# Patient Record
Sex: Female | Born: 1995 | Race: Black or African American | Hispanic: No | Marital: Single | Smoking: Former smoker
Health system: Southern US, Community
[De-identification: ages and names within clinical notes are randomized; demographics above are authoritative.]

## PROBLEM LIST (undated history)

## (undated) ENCOUNTER — Inpatient Hospital Stay (HOSPITAL_COMMUNITY): Payer: Self-pay

## (undated) DIAGNOSIS — R519 Headache, unspecified: Secondary | ICD-10-CM

## (undated) DIAGNOSIS — R51 Headache: Secondary | ICD-10-CM

## (undated) DIAGNOSIS — Z789 Other specified health status: Secondary | ICD-10-CM

## (undated) DIAGNOSIS — B977 Papillomavirus as the cause of diseases classified elsewhere: Secondary | ICD-10-CM

## (undated) HISTORY — PX: NO PAST SURGERIES: SHX2092

---
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
## 2017-05-01 LAB — OB RESULTS CONSOLE GC/CHLAMYDIA
CHLAMYDIA, DNA PROBE: NEGATIVE
Gonorrhea: NEGATIVE

## 2017-06-06 ENCOUNTER — Encounter: Payer: Self-pay | Admitting: *Deleted

## 2017-06-12 ENCOUNTER — Telehealth: Payer: Self-pay | Admitting: Family Medicine

## 2017-06-12 ENCOUNTER — Encounter: Payer: Self-pay | Admitting: Family Medicine

## 2017-06-12 NOTE — Telephone Encounter (Signed)
Called patient on 12/18, and 12/19 about needing to move her appointment. There was no voicemail so I could leave her a message. Will reschedule, and send letter.

## 2017-06-13 ENCOUNTER — Encounter: Payer: Self-pay | Admitting: Student

## 2017-06-21 ENCOUNTER — Encounter: Payer: Self-pay | Admitting: Certified Nurse Midwife

## 2017-07-01 ENCOUNTER — Encounter: Payer: Medicaid Other | Admitting: Advanced Practice Midwife

## 2017-07-12 ENCOUNTER — Ambulatory Visit (INDEPENDENT_AMBULATORY_CARE_PROVIDER_SITE_OTHER): Payer: Medicaid Other | Admitting: Family Medicine

## 2017-07-12 ENCOUNTER — Other Ambulatory Visit (HOSPITAL_COMMUNITY)
Admission: RE | Admit: 2017-07-12 | Discharge: 2017-07-12 | Disposition: A | Discharge status: Home / Self Care | Payer: Medicaid Other | Source: Ambulatory Visit | Attending: Family Medicine | Admitting: Family Medicine

## 2017-07-12 ENCOUNTER — Encounter: Payer: Self-pay | Admitting: Family Medicine

## 2017-07-12 VITALS — BP 100/60 | HR 90 | Ht 61.0 in | Wt 124.0 lb

## 2017-07-12 DIAGNOSIS — Z3402 Encounter for supervision of normal first pregnancy, second trimester: Secondary | ICD-10-CM | POA: Insufficient documentation

## 2017-07-12 DIAGNOSIS — Z23 Encounter for immunization: Secondary | ICD-10-CM | POA: Diagnosis not present

## 2017-07-12 DIAGNOSIS — O09899 Supervision of other high risk pregnancies, unspecified trimester: Secondary | ICD-10-CM | POA: Insufficient documentation

## 2017-07-12 DIAGNOSIS — O09892 Supervision of other high risk pregnancies, second trimester: Secondary | ICD-10-CM | POA: Diagnosis not present

## 2017-07-12 DIAGNOSIS — Z3490 Encounter for supervision of normal pregnancy, unspecified, unspecified trimester: Secondary | ICD-10-CM | POA: Insufficient documentation

## 2017-07-12 NOTE — Progress Notes (Signed)
  Subjective:    Anita Love is a G1P0 6224w1d being seen today for her first obstetrical visit.  Her obstetrical history is significant for teen pregnancy. Patient unsure if she intend to breast feed. Pregnancy history fully reviewed.  Patient reports no complaints.  Vitals:   07/12/17 1551 07/12/17 1553  BP: 100/60   Pulse: 90   Weight: 56.2 kg (124 lb)   Height:  5\' 1"  (1.549 m)    HISTORY: OB History  Gravida Para Term Preterm AB Living  1            SAB TAB Ectopic Multiple Live Births               # Outcome Date GA Lbr Len/2nd Weight Sex Delivery Anes PTL Lv  1 Current              No past medical history on file. No past surgical history on file. No family history on file.   Exam   BP 100/60   Pulse 90   Ht 5\' 1"  (1.549 m)   Wt 56.2 kg (124 lb)   LMP 02/07/2017 (Exact Date)   BMI 23.43 kg/m    CONSTITUTIONAL: Well-developed, well-nourished female in no acute distress.  EYES: EOM intact, conjunctivae normal, no scleral icterus HEAD: Normocephalic, atraumatic ENT: External right and left ear normal, oropharynx is clear and moist. CARDIOVASCULAR: Normal heart rate noted, regular rhythm. No cyanosis or edema. 2+ distal pulses.  RESPIRATORY: Clear to auscultation bilaterally. Effort and breath sounds normal, no problems with respiration noted. GASTROINTESTINAL:Soft, gravid.  normal bowel sounds, no distention noted.  No tenderness, rebound or guarding.  MUSCULOSKELETAL: Normal range of motion. No tenderness. SKIN: Skin is warm and dry. No rash noted. Not diaphoretic. No erythema. No pallor. NEUROLGIC: Alert and oriented to person, place, and time. Normal reflexes, muscle tone, coordination. No cranial nerve deficit noted. PSYCHIATRIC: Normal mood and affect. Normal behavior. Normal judgment and thought content. HEM/LYMPH/IMMUNOLOGIC: Neck supple, no masses.  Normal thyroid.      Assessment:    Pregnancy: G1P0 Patient Active Problem List   Diagnosis Date  Noted  . Encounter for supervision of normal pregnancy 07/12/2017        Plan:     Initial labs drawn. Prenatal vitamins. Problem list reviewed and updated. Genetic Screening discussed: too late  Ultrasound discussed; fetal survey: ordered.  Follow up in 4 weeks.  [address] Orchard DriveAmber GolovinMoss, DO 07/12/2017

## 2017-07-12 NOTE — Patient Instructions (Signed)

## 2017-07-13 LAB — OBSTETRIC PANEL, INCLUDING HIV
ANTIBODY SCREEN: NEGATIVE
BASOS: 0 %
Basophils Absolute: 0 10*3/uL (ref 0.0–0.2)
EOS (ABSOLUTE): 0 10*3/uL (ref 0.0–0.4)
Eos: 0 %
HEMATOCRIT: 37.2 % (ref 34.0–46.6)
HEP B S AG: NEGATIVE
HIV SCREEN 4TH GENERATION: NONREACTIVE
Hemoglobin: 12.2 g/dL (ref 11.1–15.9)
Immature Grans (Abs): 0 10*3/uL (ref 0.0–0.1)
Immature Granulocytes: 0 %
LYMPHS ABS: 1.9 10*3/uL (ref 0.7–3.1)
Lymphs: 21 %
MCH: 30.4 pg (ref 26.6–33.0)
MCHC: 32.8 g/dL (ref 31.5–35.7)
MCV: 93 fL (ref 79–97)
MONOCYTES: 7 %
Monocytes Absolute: 0.6 10*3/uL (ref 0.1–0.9)
Neutrophils Absolute: 6.2 10*3/uL (ref 1.4–7.0)
Neutrophils: 72 %
Platelets: 183 10*3/uL (ref 150–379)
RBC: 4.01 x10E6/uL (ref 3.77–5.28)
RDW: 13.5 % (ref 12.3–15.4)
RH TYPE: POSITIVE
RPR: NONREACTIVE
RUBELLA: 18.7 {index} (ref >0.99)
WBC: 8.7 10*3/uL (ref 3.4–10.8)

## 2017-07-14 LAB — CULTURE, OB URINE

## 2017-07-14 LAB — URINE CULTURE, OB REFLEX

## 2017-07-16 LAB — GC/CHLAMYDIA PROBE AMP (~~LOC~~) NOT AT ARMC
CHLAMYDIA, DNA PROBE: NEGATIVE
NEISSERIA GONORRHEA: NEGATIVE

## 2017-07-18 ENCOUNTER — Ambulatory Visit (HOSPITAL_COMMUNITY)
Admission: RE | Admit: 2017-07-18 | Discharge: 2017-07-18 | Disposition: A | Discharge status: Home / Self Care | Payer: Medicaid Other | Source: Ambulatory Visit | Attending: Family Medicine | Admitting: Family Medicine

## 2017-07-18 ENCOUNTER — Other Ambulatory Visit: Payer: Self-pay | Admitting: Family Medicine

## 2017-07-18 DIAGNOSIS — O0930 Supervision of pregnancy with insufficient antenatal care, unspecified trimester: Secondary | ICD-10-CM

## 2017-07-18 DIAGNOSIS — Z3689 Encounter for other specified antenatal screening: Secondary | ICD-10-CM

## 2017-07-18 DIAGNOSIS — Z3402 Encounter for supervision of normal first pregnancy, second trimester: Secondary | ICD-10-CM

## 2017-07-18 DIAGNOSIS — Z3A23 23 weeks gestation of pregnancy: Secondary | ICD-10-CM | POA: Insufficient documentation

## 2017-07-18 DIAGNOSIS — O0932 Supervision of pregnancy with insufficient antenatal care, second trimester: Secondary | ICD-10-CM | POA: Insufficient documentation

## 2017-07-22 LAB — SMN1 COPY NUMBER ANALYSIS (SMA CARRIER SCREENING)

## 2017-08-07 ENCOUNTER — Ambulatory Visit (INDEPENDENT_AMBULATORY_CARE_PROVIDER_SITE_OTHER): Payer: Medicaid Other | Admitting: Advanced Practice Midwife

## 2017-08-07 VITALS — BP 96/51 | HR 84 | Wt 127.8 lb

## 2017-08-07 DIAGNOSIS — Z3A27 27 weeks gestation of pregnancy: Secondary | ICD-10-CM

## 2017-08-07 DIAGNOSIS — Z362 Encounter for other antenatal screening follow-up: Secondary | ICD-10-CM

## 2017-08-07 DIAGNOSIS — Z3402 Encounter for supervision of normal first pregnancy, second trimester: Secondary | ICD-10-CM

## 2017-08-07 NOTE — Progress Notes (Signed)
   PRENATAL VISIT NOTE  Subjective:  Anita Love is a 22 y.o. G1P0 at 6570w6d being seen today for ongoing prenatal care.  She is currently monitored for the following issues for this low-risk pregnancy and has Encounter for supervision of normal pregnancy and High risk teen pregnancy on their problem list.  Patient reports no complaints.  Contractions: Not present. Vag. Bleeding: None.  Movement: Present. Denies leaking of fluid.   The following portions of the patient's history were reviewed and updated as appropriate: allergies, current medications, past family history, past medical history, past social history, past surgical history and problem list. Problem list updated.  Objective:   Vitals:   08/07/17 1202  BP: (!) 96/51  Pulse: 84  Weight: 127 lb 12.8 oz (58 kg)    Fetal Status: Fetal Heart Rate (bpm): 147 Fundal Height: 26 cm Movement: Present     General:  Alert, oriented and cooperative. Patient is in no acute distress.  Skin: Skin is warm and dry. No rash noted.   Cardiovascular: Normal heart rate noted  Respiratory: Normal respiratory effort, no problems with respiration noted  Abdomen: Soft, gravid, appropriate for gestational age.  Pain/Pressure: Present     Pelvic: Cervical exam deferred        Extremities: Normal range of motion.  Edema: None  Mental Status:  Normal mood and affect. Normal behavior. Normal judgment and thought content.   Assessment and Plan:  Pregnancy: G1P0 at 3070w6d  1. [redacted] weeks gestation of pregnancy  - US MFM OB FOLLOW UP; Future  2. Encounter for other antenatal screening follow-up  - US MFM OB FOLLOW UP; Future  3. Supervision of normal first teen pregnancy in second trimester  - US MFM OB FOLLOW UP; Future  4. Encounter for supervision of normal first pregnancy in second trimester  Preterm labor symptoms and general obstetric precautions including but not limited to vaginal bleeding, contractions, leaking of fluid and fetal  movement were reviewed in detail with the patient. Please refer to After Visit Summary for other counseling recommendations.  Return in about 2 weeks (around 08/21/2017) for ROB/GTT.   Dorathy KinsmanVirginia Fermon Ureta, CNM

## 2017-08-07 NOTE — Patient Instructions (Addendum)
TDaP Vaccine Pregnancy Get the Whooping Cough Vaccine While You Are Pregnant (CDC)  It is important for women to get the whooping cough vaccine in the third trimester of each pregnancy. Vaccines are the best way to prevent this disease. There are 2 different whooping cough vaccines. Both vaccines combine protection against whooping cough, tetanus and diphtheria, but they are for different age groups: Tdap: for everyone 11 years or older, including pregnant women  DTaP: for children 2 months through 6 years of age  You need the whooping cough vaccine during each of your pregnancies The recommended time to get the shot is during your 27th through 36th week of pregnancy, preferably during the earlier part of this time period. The Centers for Disease Control and Prevention (CDC) recommends that pregnant women receive the whooping cough vaccine for adolescents and adults (called Tdap vaccine) during the third trimester of each pregnancy. The recommended time to get the shot is during your 27th through 36th week of pregnancy, preferably during the earlier part of this time period. This replaces the original recommendation that pregnant women get the vaccine only if they had not previously received it. The American College of Obstetricians and Gynecologists and the American College of Nurse-Midwives support this recommendation.  You should get the whooping cough vaccine while pregnant to pass protection to your baby frame support disabled and/or not supported in this browser  Learn why Anita Love decided to get the whooping cough vaccine in her 3rd trimester of pregnancy and how her baby girl was born with some protection against the disease. Also available on YouTube. After receiving the whooping cough vaccine, your body will create protective antibodies (proteins produced by the body to fight off diseases) and pass some of them to your baby before birth. These antibodies provide your baby some short-term  protection against whooping cough in early life. These antibodies can also protect your baby from some of the more serious complications that come along with whooping cough. Your protective antibodies are at their highest about 2 weeks after getting the vaccine, but it takes time to pass them to your baby. So the preferred time to get the whooping cough vaccine is early in your third trimester. The amount of whooping cough antibodies in your body decreases over time. That is why CDC recommends you get a whooping cough vaccine during each pregnancy. Doing so allows each of your babies to get the greatest number of protective antibodies from you. This means each of your babies will get the best protection possible against this disease.  Getting the whooping cough vaccine while pregnant is better than getting the vaccine after you give birth Whooping cough vaccination during pregnancy is ideal so your baby will have short-term protection as soon as he is born. This early protection is important because your baby will not start getting his whooping cough vaccines until he is 2 months old. These first few months of life are when your baby is at greatest risk for catching whooping cough. This is also when he's at greatest risk for having severe, potentially life-threating complications from the infection. To avoid that gap in protection, it is best to get a whooping cough vaccine during pregnancy. You will then pass protection to your baby before he is born. To continue protecting your baby, he should get whooping cough vaccines starting at 2 months old. You may never have gotten the Tdap vaccine before and did not get it during this pregnancy. If so, you should make sure   to get the vaccine immediately after you give birth, before leaving the hospital or birthing center. It will take about 2 weeks before your body develops protection (antibodies) in response to the vaccine. Once you have protection from the vaccine,  you are less likely to give whooping cough to your newborn while caring for him. But remember, your baby will still be at risk for catching whooping cough from others. A recent study looked to see how effective Tdap was at preventing whooping cough in babies whose mothers got the vaccine while pregnant or in the hospital after giving birth. The study found that getting Tdap between 27 through 36 weeks of pregnancy is 85% more effective at preventing whooping cough in babies younger than 2 months old. Blood tests cannot tell if you need a whooping cough vaccine There are no blood tests that can tell you if you have enough antibodies in your body to protect yourself or your baby against whooping cough. Even if you have been sick with whooping cough in the past or previously received the vaccine, you still should get the vaccine during each pregnancy. Breastfeeding may pass some protective antibodies onto your baby By breastfeeding, you may pass some antibodies you have made in response to the vaccine to your baby. When you get a whooping cough vaccine during your pregnancy, you will have antibodies in your breast milk that you can share with your baby as soon as your milk comes in. However, your baby will not get protective antibodies immediately if you wait to get the whooping cough vaccine until after delivering your baby. This is because it takes about 2 weeks for your body to create antibodies. Learn more about the health benefits of breastfeeding.  AREA PEDIATRIC/FAMILY PRACTICE PHYSICIANS  [facility] [address] [place], [place]  [address] Phone - [phone]   Fax - [phone]  [facility] [address] [place], [place] [address] Phone - [phone]   Fax - [phone]  JACK AMOS [address] [place], [place]  [address] Phone - [phone]   Fax - [phone]  [facility] [address] [place]  [address] Phone - [phone]   Fax - [phone]  [facility] [address] [place], [place]  [address] Phone - [phone]   Fax - [phone]  [facility] [address], [address] [place], [place]  [address] Phone - [phone]   Fax - [phone]  [facility] [address] [place], [place]  [address] Phone - [phone]   Fax - [phone]  [facility] [address] [place], [place]  [address] Phone - [phone]   Fax - [phone]  [facility] [address] [place], [place]  [address] Phone - [phone]   Fax - [phone] [facility] [address] [place], [place]  [address] Phone - [phone]   Fax - [phone]  [facility] [address] [place], [place]  [address] Phone - [phone]   Fax - [phone]  [facility] [address], [address] [place], [place]  [address] Phone - [phone]   Fax - [phone]  [facility] [address] [place], [place]  [address] Phone - [phone]   Fax - [phone]  [facility] [address], [address]  [place], [place]  [address] Phone - [phone]  [facility] [address] [place], [place]  [address] Phone - [phone]   Fax - [phone]  [facility] [address] [place], [place]  [address] Phone - [phone]   Fax - [phone]  [facility] [address] [place], [place]  [address] Phone - [phone]   Fax - [phone]  [facility] [address] [place], [place]  [address] Phone - [phone]   Fax - [phone]   [facility] [address] [place], [place]  [address] Phone - [phone]   Fax - [phone]  [facility] [address] [place], [place]  [address] Phone - [phone]   Fax - [phone]  DAVID RUBIN [address] [place], [place]  [address] Phone - [phone]   Fax - [phone]  [facility] [address] [place], [place]  [address] Phone - [phone]   Fax - [phone]  [facility] Alita Chyle [address] [place], [place]  [address] Phone - [phone]   Fax - [phone] Gerarda Fraction [address] [place], [place]  [address] Phone - [phone]   Fax - [phone]  [facility] [address] [place], [place]  [address] Phone - [phone]   Fax - [phone]  [facility] [place] [address] [place], [place]  [address] Phone - [phone]   Fax - [phone]  [facility] [place] Wyvonne Lenz MD [address] [place] [address] Phone [phone]  Fax [phone]

## 2017-08-14 ENCOUNTER — Ambulatory Visit (HOSPITAL_COMMUNITY)
Admission: RE | Admit: 2017-08-14 | Discharge: 2017-08-14 | Disposition: A | Discharge status: Home / Self Care | Payer: Medicaid Other | Source: Ambulatory Visit | Attending: Advanced Practice Midwife | Admitting: Advanced Practice Midwife

## 2017-08-14 DIAGNOSIS — Z3A26 26 weeks gestation of pregnancy: Secondary | ICD-10-CM | POA: Diagnosis not present

## 2017-08-14 DIAGNOSIS — Z362 Encounter for other antenatal screening follow-up: Secondary | ICD-10-CM | POA: Insufficient documentation

## 2017-08-14 DIAGNOSIS — Z3402 Encounter for supervision of normal first pregnancy, second trimester: Secondary | ICD-10-CM | POA: Diagnosis present

## 2017-08-14 DIAGNOSIS — Z3A27 27 weeks gestation of pregnancy: Secondary | ICD-10-CM

## 2017-08-15 ENCOUNTER — Encounter: Payer: Self-pay | Admitting: Advanced Practice Midwife

## 2017-08-28 ENCOUNTER — Ambulatory Visit (INDEPENDENT_AMBULATORY_CARE_PROVIDER_SITE_OTHER): Payer: Medicaid Other | Admitting: Student

## 2017-08-28 VITALS — BP 114/63 | HR 89 | Wt 131.0 lb

## 2017-08-28 DIAGNOSIS — Z23 Encounter for immunization: Secondary | ICD-10-CM | POA: Diagnosis not present

## 2017-08-28 DIAGNOSIS — Z3402 Encounter for supervision of normal first pregnancy, second trimester: Secondary | ICD-10-CM

## 2017-08-28 DIAGNOSIS — Z3403 Encounter for supervision of normal first pregnancy, third trimester: Secondary | ICD-10-CM | POA: Diagnosis not present

## 2017-08-28 NOTE — Progress Notes (Signed)
   PRENATAL VISIT NOTE  Subjective:  Anita Love is a 82[place]21 y.o. G1P0 at 7580w6d being seen today for ongoing prenatal care.  She is currently monitored for the following issues for this low-risk pregnancy and has Encounter for supervision of normal pregnancy and High risk teen pregnancy on their problem list.  Patient reports no complaints.  Contractions: Not present. Vag. Bleeding: None.  Movement: Present. Denies leaking of fluid.   The following portions of the patient's history were reviewed and updated as appropriate: allergies, current medications, past family history, past medical history, past social history, past surgical history and problem list. Problem list updated.  Objective:   Vitals:   08/28/17 1043  BP: 114/63  Pulse: 89  Weight: 131 lb (59.4 kg)    Fetal Status: Fetal Heart Rate (bpm): 140 Fundal Height: 27 cm Movement: Present     General:  Alert, oriented and cooperative. Patient is in no acute distress.  Skin: Skin is warm and dry. No rash noted.   Cardiovascular: Normal heart rate noted  Respiratory: Normal respiratory effort, no problems with respiration noted  Abdomen: Soft, gravid, appropriate for gestational age.  Pain/Pressure: Present     Pelvic: Cervical exam deferred        Extremities: Normal range of motion.  Edema: None  Mental Status:  Normal mood and affect. Normal behavior. Normal judgment and thought content.   Assessment and Plan:  Pregnancy: G1P0 at 7080w6d  1. Encounter for supervision of normal first pregnancy in second trimester -Ultrasound x 2, RVOT not visualized. Discussed with patient. Patient would like additional ultrasound to see if it can be viewed. Ultrasound ordered and scheduled.  - CBC - HIV antibody - Glucose Tolerance, 2 Hours w/1 Hour - RPR - Tdap vaccine greater than or equal to 7yo IM - US MFM OB FOLLOW UP; Future  Preterm labor symptoms and general obstetric precautions including but not limited to vaginal bleeding,  contractions, leaking of fluid and fetal movement were reviewed in detail with the patient. Please refer to After Visit Summary for other counseling recommendations.  Return in about 2 weeks (around 09/11/2017) for Routine OB.   Judeth HornErin Tyrease Vandeberg, NP

## 2017-08-28 NOTE — Progress Notes (Signed)
Tdap given today 08/28/17

## 2017-08-28 NOTE — Patient Instructions (Signed)
Third Trimester of Pregnancy The third trimester is from week 28 through week 40 (months 7 through 9). The third trimester is a time when the unborn baby (fetus) is growing rapidly. At the end of the ninth month, the fetus is about 20 inches in length and weighs 6-10 pounds. Body changes during your third trimester Your body will continue to go through many changes during pregnancy. The changes vary from woman to woman. During the third trimester:  Your weight will continue to increase. You can expect to gain 25-35 pounds (11-16 kg) by the end of the pregnancy.  You may begin to get stretch marks on your hips, abdomen, and breasts.  You may urinate more often because the fetus is moving lower into your pelvis and pressing on your bladder.  You may develop or continue to have heartburn. This is caused by increased hormones that slow down muscles in the digestive tract.  You may develop or continue to have constipation because increased hormones slow digestion and cause the muscles that push waste through your intestines to relax.  You may develop hemorrhoids. These are swollen veins (varicose veins) in the rectum that can itch or be painful.  You may develop swollen, bulging veins (varicose veins) in your legs.  You may have increased body aches in the pelvis, back, or thighs. This is due to weight gain and increased hormones that are relaxing your joints.  You may have changes in your hair. These can include thickening of your hair, rapid growth, and changes in texture. Some women also have hair loss during or after pregnancy, or hair that feels dry or thin. Your hair will most likely return to normal after your baby is born.  Your breasts will continue to grow and they will continue to become tender. A yellow fluid (colostrum) may leak from your breasts. This is the first milk you are producing for your baby.  Your belly button may stick out.  You may notice more swelling in your hands,  face, or ankles.  You may have increased tingling or numbness in your hands, arms, and legs. The skin on your belly may also feel numb.  You may feel short of breath because of your expanding uterus.  You may have more problems sleeping. This can be caused by the size of your belly, increased need to urinate, and an increase in your body's metabolism.  You may notice the fetus "dropping," or moving lower in your abdomen (lightening).  You may have increased vaginal discharge.  You may notice your joints feel loose and you may have pain around your pelvic bone.  What to expect at prenatal visits You will have prenatal exams every 2 weeks until week 36. Then you will have weekly prenatal exams. During a routine prenatal visit:  You will be weighed to make sure you and the baby are growing normally.  Your blood pressure will be taken.  Your abdomen will be measured to track your baby's growth.  The fetal heartbeat will be listened to.  Any test results from the previous visit will be discussed.  You may have a cervical check near your due date to see if your cervix has softened or thinned (effaced).  You will be tested for Group B streptococcus. This happens between 35 and 37 weeks.  Your health care provider may ask you:  What your birth plan is.  How you are feeling.  If you are feeling the baby move.  If you have had   any abnormal symptoms, such as leaking fluid, bleeding, severe headaches, or abdominal cramping.  If you are using any tobacco products, including cigarettes, chewing tobacco, and electronic cigarettes.  If you have any questions.  Other tests or screenings that may be performed during your third trimester include:  Blood tests that check for low iron levels (anemia).  Fetal testing to check the health, activity level, and growth of the fetus. Testing is done if you have certain medical conditions or if there are problems during the  pregnancy.  Nonstress test (NST). This test checks the health of your baby to make sure there are no signs of problems, such as the baby not getting enough oxygen. During this test, a belt is placed around your belly. The baby is made to move, and its heart rate is monitored during movement.  What is false labor? False labor is a condition in which you feel small, irregular tightenings of the muscles in the womb (contractions) that usually go away with rest, changing position, or drinking water. These are called Braxton Hicks contractions. Contractions may last for hours, days, or even weeks before true labor sets in. If contractions come at regular intervals, become more frequent, increase in intensity, or become painful, you should see your health care provider. What are the signs of labor?  Abdominal cramps.  Regular contractions that start at 10 minutes apart and become stronger and more frequent with time.  Contractions that start on the top of the uterus and spread down to the lower abdomen and back.  Increased pelvic pressure and dull back pain.  A watery or bloody mucus discharge that comes from the vagina.  Leaking of amniotic fluid. This is also known as your "water breaking." It could be a slow trickle or a gush. Let your health care provider know if it has a color or strange odor. If you have any of these signs, call your health care provider right away, even if it is before your due date. Follow these instructions at home: Medicines  Follow your health care provider's instructions regarding medicine use. Specific medicines may be either safe or unsafe to take during pregnancy.  Take a prenatal vitamin that contains at least 600 micrograms (mcg) of folic acid.  If you develop constipation, try taking a stool softener if your health care provider approves. Eating and drinking  Eat a balanced diet that includes fresh fruits and vegetables, whole grains, good sources of protein  such as meat, eggs, or tofu, and low-fat dairy. Your health care provider will help you determine the amount of weight gain that is right for you.  Avoid raw meat and uncooked cheese. These carry germs that can cause birth defects in the baby.  If you have low calcium intake from food, talk to your health care provider about whether you should take a daily calcium supplement.  Eat four or five small meals rather than three large meals a day.  Limit foods that are high in fat and processed sugars, such as fried and sweet foods.  To prevent constipation: ? Drink enough fluid to keep your urine clear or pale yellow. ? Eat foods that are high in fiber, such as fresh fruits and vegetables, whole grains, and beans. Activity  Exercise only as directed by your health care provider. Most women can continue their usual exercise routine during pregnancy. Try to exercise for 30 minutes at least 5 days a week. Stop exercising if you experience uterine contractions.  Avoid heavy   lifting.  Do not exercise in extreme heat or humidity, or at high altitudes.  Wear low-heel, comfortable shoes.  Practice good posture.  You may continue to have sex unless your health care provider tells you otherwise. Relieving pain and discomfort  Take frequent breaks and rest with your legs elevated if you have leg cramps or low back pain.  Take warm sitz baths to soothe any pain or discomfort caused by hemorrhoids. Use hemorrhoid cream if your health care provider approves.  Wear a good support bra to prevent discomfort from breast tenderness.  If you develop varicose veins: ? Wear support pantyhose or compression stockings as told by your healthcare provider. ? Elevate your feet for 15 minutes, 3-4 times a day. Prenatal care  Write down your questions. Take them to your prenatal visits.  Keep all your prenatal visits as told by your health care provider. This is important. Safety  Wear your seat belt at  all times when driving.  Make a list of emergency phone numbers, including numbers for family, friends, the hospital, and police and fire departments. General instructions  Avoid cat litter boxes and soil used by cats. These carry germs that can cause birth defects in the baby. If you have a cat, ask someone to clean the litter box for you.  Do not travel far distances unless it is absolutely necessary and only with the approval of your health care provider.  Do not use hot tubs, steam rooms, or saunas.  Do not drink alcohol.  Do not use any products that contain nicotine or tobacco, such as cigarettes and e-cigarettes. If you need help quitting, ask your health care provider.  Do not use any medicinal herbs or unprescribed drugs. These chemicals affect the formation and growth of the baby.  Do not douche or use tampons or scented sanitary pads.  Do not cross your legs for long periods of time.  To prepare for the arrival of your baby: ? Take prenatal classes to understand, practice, and ask questions about labor and delivery. ? Make a trial run to the hospital. ? Visit the hospital and tour the maternity area. ? Arrange for maternity or paternity leave through employers. ? Arrange for family and friends to take care of pets while you are in the hospital. ? Purchase a rear-facing car seat and make sure you know how to install it in your car. ? Pack your hospital bag. ? Prepare the baby's nursery. Make sure to remove all pillows and stuffed animals from the baby's crib to prevent suffocation.  Visit your dentist if you have not gone during your pregnancy. Use a soft toothbrush to brush your teeth and be gentle when you floss. Contact a health care provider if:  You are unsure if you are in labor or if your water has broken.  You become dizzy.  You have mild pelvic cramps, pelvic pressure, or nagging pain in your abdominal area.  You have lower back pain.  You have persistent  nausea, vomiting, or diarrhea.  You have an unusual or bad smelling vaginal discharge.  You have pain when you urinate. Get help right away if:  Your water breaks before 37 weeks.  You have regular contractions less than 5 minutes apart before 37 weeks.  You have a fever.  You are leaking fluid from your vagina.  You have spotting or bleeding from your vagina.  You have severe abdominal pain or cramping.  You have rapid weight loss or weight gain.    You have shortness of breath with chest pain.  You notice sudden or extreme swelling of your face, hands, ankles, feet, or legs.  Your baby makes fewer than 10 movements in 2 hours.  You have severe headaches that do not go away when you take medicine.  You have vision changes. Summary  The third trimester is from week 28 through week 40, months 7 through 9. The third trimester is a time when the unborn baby (fetus) is growing rapidly.  During the third trimester, your discomfort may increase as you and your baby continue to gain weight. You may have abdominal, leg, and back pain, sleeping problems, and an increased need to urinate.  During the third trimester your breasts will keep growing and they will continue to become tender. A yellow fluid (colostrum) may leak from your breasts. This is the first milk you are producing for your baby.  False labor is a condition in which you feel small, irregular tightenings of the muscles in the womb (contractions) that eventually go away. These are called Braxton Hicks contractions. Contractions may last for hours, days, or even weeks before true labor sets in.  Signs of labor can include: abdominal cramps; regular contractions that start at 10 minutes apart and become stronger and more frequent with time; watery or bloody mucus discharge that comes from the vagina; increased pelvic pressure and dull back pain; and leaking of amniotic fluid. This information is not intended to replace advice  given to you by your health care provider. Make sure you discuss any questions you have with your health care provider. Document Released: 06/05/2001 Document Revised: 11/17/2015 Document Reviewed: 08/12/2012 [employer] Interactive Patient Education  2017 [employer]  

## 2017-08-29 LAB — CBC
Hematocrit: 37.1 % (ref 34.0–46.6)
Hemoglobin: 12.1 g/dL (ref 11.1–15.9)
MCH: 30.8 pg (ref 26.6–33.0)
MCHC: 32.6 g/dL (ref 31.5–35.7)
MCV: 94 fL (ref 79–97)
PLATELETS: 160 10*3/uL (ref 150–379)
RBC: 3.93 x10E6/uL (ref 3.77–5.28)
RDW: 13.9 % (ref 12.3–15.4)
WBC: 8.1 10*3/uL (ref 3.4–10.8)

## 2017-08-29 LAB — RPR: RPR Ser Ql: NONREACTIVE

## 2017-08-29 LAB — GLUCOSE TOLERANCE, 2 HOURS W/ 1HR
GLUCOSE, 1 HOUR: 150 mg/dL (ref 65–179)
GLUCOSE, 2 HOUR: 99 mg/dL (ref 65–152)
Glucose, Fasting: 73 mg/dL (ref 65–91)

## 2017-08-29 LAB — HIV ANTIBODY (ROUTINE TESTING W REFLEX): HIV SCREEN 4TH GENERATION: NONREACTIVE

## 2017-09-04 ENCOUNTER — Ambulatory Visit (HOSPITAL_COMMUNITY)
Admission: RE | Admit: 2017-09-04 | Discharge: 2017-09-04 | Disposition: A | Discharge status: Home / Self Care | Payer: Medicaid Other | Source: Ambulatory Visit | Attending: Student | Admitting: Student

## 2017-09-04 DIAGNOSIS — Z3A29 29 weeks gestation of pregnancy: Secondary | ICD-10-CM | POA: Insufficient documentation

## 2017-09-04 DIAGNOSIS — O358XX0 Maternal care for other (suspected) fetal abnormality and damage, not applicable or unspecified: Secondary | ICD-10-CM | POA: Diagnosis not present

## 2017-09-04 DIAGNOSIS — Z362 Encounter for other antenatal screening follow-up: Secondary | ICD-10-CM | POA: Insufficient documentation

## 2017-09-04 DIAGNOSIS — Z3402 Encounter for supervision of normal first pregnancy, second trimester: Secondary | ICD-10-CM

## 2017-09-10 ENCOUNTER — Telehealth: Payer: Self-pay | Admitting: Licensed Clinical Social Worker

## 2017-09-10 NOTE — Telephone Encounter (Signed)
CSW A.Felton ClintonFigueroa contacted pt to confirm appt for 09/11/2017. Pt confirmed appt with CSW.

## 2017-09-11 ENCOUNTER — Ambulatory Visit (INDEPENDENT_AMBULATORY_CARE_PROVIDER_SITE_OTHER): Payer: Medicaid Other | Admitting: Advanced Practice Midwife

## 2017-09-11 ENCOUNTER — Encounter: Payer: Self-pay | Admitting: Licensed Clinical Social Worker

## 2017-09-11 ENCOUNTER — Encounter: Payer: Self-pay | Admitting: Advanced Practice Midwife

## 2017-09-11 VITALS — BP 111/65 | HR 95 | Wt 136.1 lb

## 2017-09-11 DIAGNOSIS — Z3403 Encounter for supervision of normal first pregnancy, third trimester: Secondary | ICD-10-CM

## 2017-09-11 NOTE — Patient Instructions (Addendum)
AREA PEDIATRIC/FAMILY PRACTICE PHYSICIANS   [facility] [address], [address] [place], [place]  [address] Phone - [phone]   Fax - [phone]  [facility] [address] [place], [place] [address] Phone - [phone]   Fax - [phone]  [facility] AM[facility] [address] [place], [place]  [address] Phone - [phone]   Fax - [phone]  [facility] [address] [place], [place]  [address] Phone - [phone]   Fax - [phone]  [facility] [address] [place], [place]  [address] Phone - [phone]   Fax - [phone]  [facility] [address] [place], [place]  [address] Phone - [phone]   Fax - [phone]  [facility] [address] [place], [place]  [address] Phone - [phone]   Fax - [phone]  [facility] [address] [place], [place]  [address] Phone - [phone]   Fax - [phone]  [facility] [address] [place], [place]  [address] Phone - [phone]   Fax - [phone] [facility] [address] [place], [place]  [address] Phone - [phone]   Fax - [phone]  [facility] [address] [place], [place]  [address] Phone - [phone]   Fax - [phone]  [facility] [address] [place], [place]  [address] Phone - [phone]   Fax - [phone]  [facility] [address] [place], [place]  [address] Phone - [phone]   Fax - [phone]  SHILPA GOSRANI [address] [place], [place]  [address] Phone - [phone]  [facility] [address] [place], [place]  [address] Phone - [phone]   Fax - [phone]  [facility] [address] [place], [place]  [address] Phone - [phone]   Fax - [phone]  [facility] [address] [place], [place]  [address] Phone - [phone]   Fax - [phone]  [facility] [address] [place], [place]  [address] Phone - [phone]   Fax - [phone]   [facility] [address] [place], [place]  [address] Phone - [phone]   Fax - [phone]  [facility] [address] [place], [place]  [address] Phone - [phone]   Fax - [phone]  DAVID RUBIN [address] [place], [place]  [address] Phone - [phone]   Fax - [phone]  [facility] [address] [place], [place]  [address] Phone - [phone]   Fax - [phone]  [facility] [address] , [place]  [address] Phone - [phone]   Fax - [phone] [facility] [address] [place], [place]  [address] Phone - [phone]   Fax - [phone]  [facility] [address] [place], [place]  [address] Phone - [phone]   Fax - [phone]  [facility] [address] [place], [place]  [address] Phone - [phone]   Fax - [phone]  [facility] Charlene Flemming MD [address] [place]  [address] Phone [phone]  Fax [phone]  Childbirth Education Options: [facility] Classes:  Childbirth education classes can help you   get ready for a positive parenting experience. You can also meet other expectant parents and get free stuff for your baby. Each class runs for five weeks on the same night and costs $45 for the mother-to-be and her support person. Medicaid covers the cost if you are eligible. Call [phone] to register. [facility] Childbirth Education:  [phone] or [phone] or  [email or web address]  Baby & Me Class: Discuss newborn & infant parenting and family adjustment issues with other new mothers in a relaxed environment. Each week brings a new speaker or baby-centered activity. We encourage new mothers to join us every Thursday at 11:00am. Babies birth until crawling. No registration or fee. Daddy Boot Camp: This course offers Dads-to-be the tools and knowledge needed to feel confident on their journey to becoming new fathers. Experienced dads, who have been trained as coaches, teach dads-to-be how to hold, comfort, diaper, swaddle and play with their infant while being able to support the new mom as well. A class for men taught by men. $25/dad Big Brother/Big Sister: Let your children share in the joy of a new brother or sister in this special class designed just for them. Class includes discussion about how families care for babies: swaddling, holding, diapering, safety as well as how they can be helpful in their new role. This class is designed for children ages 2 to 6, but any age is welcome. Please register each child individually. $5/child  Mom Talk: This mom-led group offers support and connection to mothers as they journey through the adjustments and struggles of that sometimes overwhelming first year after the birth of a child. Tuesdays at 10:00am and Thursdays at 6:00pm. Babies welcome. No registration or fee. Breastfeeding Support Group: This group is a mother-to-mother support circle where moms have the opportunity to share their breastfeeding experiences. A Lactation Consultant is present for questions and concerns. Meets each Tuesday at 11:00am. No fee or registration. Breastfeeding Your Baby: Learn what to expect in the first days of breastfeeding your newborn.  This class will help you feel more confident with the skills needed to begin your breastfeeding experience. Many new mothers are concerned about breastfeeding after leaving the hospital. This class  will also address the most common fears and challenges about breastfeeding during the first few weeks, months and beyond. (call for fee) Comfort Techniques and Tour: This 2 hour interactive class will provide you the opportunity to learn & practice hands-on techniques that can help relieve some of the discomfort of labor and encourage your baby to rotate toward the best position for birth. You and your partner will be able to try a variety of labor positions with birth balls and rebozos as well as practice breathing, relaxation, and visualization techniques. A tour of the [facility] is included with this class. $20 per registrant and support person Childbirth Class- Weekend Option: This class is a Weekend version of our Birth & Baby series. It is designed for parents who have a difficult time fitting several weeks of classes into their schedule. It covers the care of your newborn and the basics of labor and childbirth. It also includes a Maternity Care Center Tour of [facility] and lunch. The class is held two consecutive days: beginning on Friday evening from 6:30 - 8:30 p.m. and the next day, Saturday from 9 a.m. - 4 p.m. (call for fee) Waterbirth Class: Interested in a waterbirth?  This informational class will help you discover whether waterbirth is the right fit for you.   Education about waterbirth itself, supplies you would need and how to assemble your support team is what you can expect from this class. Some obstetrical practices require this class in order to pursue a waterbirth. (Not all obstetrical practices offer waterbirth-check with your healthcare provider.) Register only the expectant mom, but you are encouraged to bring your partner to class! Required if planning waterbirth, no fee. Infant/Child CPR: Parents, grandparents, babysitters, and friends learn Cardio-Pulmonary Resuscitation skills for infants and children. You will also learn how to treat both conscious  and unconscious choking in infants and children. This Family & Friends program does not offer certification. Register each participant individually to ensure that enough mannequins are available. (Call for fee) Grandparent Love: Expecting a grandbaby? This class is for you! Learn about the latest infant care and safety recommendations and ways to support your own child as he or she transitions into the parenting role. Taught by Registered Nurses who are childbirth instructors, but most importantly...they are grandmothers too! $10/person. Childbirth Class- Natural Childbirth: This series of 5 weekly classes is for expectant parents who want to learn and practice natural methods of coping with the process of labor and childbirth. Relaxation, breathing, massage, visualization, role of the partner, and helpful positioning are highlighted. Participants learn how to be confident in their body's ability to give birth. This class will empower and help parents make informed decisions about their own care. Includes discussion that will help new parents transition into the immediate postpartum period. [facility] is included. We suggest taking this class between 25-32 weeks, but it's only a recommendation. $75 per registrant and one support person or $30 Medicaid. Childbirth Class- 3 week Series: This option of 3 weekly classes helps you and your labor partner prepare for childbirth. Newborn care, labor & birth, cesarean birth, pain management, and comfort techniques are discussed and a [place] [facility] is included. The class meets at the same time, on the same day of the week for 3 consecutive weeks beginning with the starting date you choose. $60 for registrant and one support person.  Marvelous Multiples: Expecting twins, triplets, or more? This class covers the differences in labor, birth, parenting, and breastfeeding issues that face multiples' parents.  NICU tour is included. Led by a Certified Childbirth Educator who is the mother of twins. No fee. Caring for Baby: This class is for expectant and adoptive parents who want to learn and practice the most up-to-date newborn care for their babies. Focus is on birth through the first six weeks of life. Topics include feeding, bathing, diapering, crying, umbilical cord care, circumcision care and safe sleep. Parents learn to recognize symptoms of illness and when to call the pediatrician. Register only the mom-to-be and your partner or support person can plan to come with you! $10 per registrant and support person Childbirth Class- online option: This online class offers you the freedom to complete a Birth and Baby series in the comfort of your own home. The flexibility of this option allows you to review sections at your own pace, at times convenient to you and your support people. It includes additional video information, animations, quizzes, and extended activities. Get organized with helpful eClass tools, checklists, and trackers. Once you register online for the class, you will receive an email within a few days to accept the invitation and begin the class when the time is right for you. The content will be available to you for 60 days. $  60 for 60 days of online access for you and your support people.  Local Doulas: Natural Baby Doulas [email or web address] Tel: [phone] [email or web address] [email or web address] [phone] [email or web address] The Labor Merla Riches  (also do waterbirth tub rental) [phone] [email or web address] [email or web address] Doula [phone] [email or web address] [facility] Rhythms  [phone] [email or web address] [employer] (PADA) [email or web address] [facility] Birth and Baby   [email or web address]  Contraception Choices Contraception, also called birth control, refers to methods or devices that prevent pregnancy. Hormonal methods Contraceptive implant A contraceptive implant is a thin, plastic tube that contains a hormone. It is inserted into the upper part of the arm. It can remain in place for up to 3 years. Progestin-only injections Progestin-only injections are injections of progestin, a synthetic form of the hormone progesterone. They are given every 3 months by a health care provider. Birth control pills Birth control pills are pills that contain hormones that prevent pregnancy. They must be taken once a day, preferably at the same time each day. Birth control patch The birth control patch contains hormones that prevent pregnancy. It is placed on the skin and must be changed once a week for three weeks and removed on the fourth week. A prescription is needed to use this method of contraception. Vaginal ring A vaginal ring contains hormones that prevent pregnancy. It is placed in the vagina for three weeks and removed on the fourth week. After that, the process is repeated with a new ring. A prescription is needed to use this method of contraception. Emergency contraceptive Emergency contraceptives prevent pregnancy after unprotected sex. They come in pill form and can be taken up to 5 days after sex. They work best the sooner they are taken after having sex. Most emergency contraceptives are available without a prescription. This method should not be used as your only form of birth control. Barrier methods Female condom A female condom is a thin sheath that is worn over the penis during sex. Condoms keep sperm from going inside a woman's body. They can be used with a spermicide to increase their effectiveness. They should be disposed after a single use. Female condom A female condom is a soft, loose-fitting sheath that is put into the vagina before sex.  The condom keeps sperm from going inside a woman's body. They should be disposed after a single use. Diaphragm A diaphragm is a soft, dome-shaped barrier. It is inserted into the vagina before sex, along with a spermicide. The diaphragm blocks sperm from entering the uterus, and the spermicide kills sperm. A diaphragm should be left in the vagina for 6-8 hours after sex and removed within 24 hours. A diaphragm is prescribed and fitted by a health care provider. A diaphragm should be replaced every 1-2 years, after giving birth, after gaining more than 15 lb (6.8 kg), and after pelvic surgery. Cervical cap A cervical cap is a round, soft latex or plastic cup that fits over the cervix. It is inserted into the vagina before sex, along with spermicide. It blocks sperm from entering the uterus. The cap should be left in place for 6-8 hours after sex and removed within 48 hours. A cervical cap must be prescribed and fitted by a health care provider. It should be replaced every 2 years. Sponge A sponge is a soft, circular piece of polyurethane foam with spermicide on it. The sponge helps block sperm from entering the uterus, and the spermicide kills sperm. To use it, you make it wet and then insert it into  the vagina. It should be inserted before sex, left in for at least 6 hours after sex, and removed and thrown away within 30 hours. Spermicides Spermicides are chemicals that kill or block sperm from entering the cervix and uterus. They can come as a cream, jelly, suppository, foam, or tablet. A spermicide should be inserted into the vagina with an applicator at least 10-15 minutes before sex to allow time for it to work. The process must be repeated every time you have sex. Spermicides do not require a prescription. Intrauterine contraception Intrauterine device (IUD) An IUD is a T-shaped device that is put in a woman's uterus. There are two types:  Hormone IUD.This type contains progestin, a synthetic form  of the hormone progesterone. This type can stay in place for 3-5 years.  Copper IUD.This type is wrapped in copper wire. It can stay in place for 10 years.  Permanent methods of contraception Female tubal ligation In this method, a woman's fallopian tubes are sealed, tied, or blocked during surgery to prevent eggs from traveling to the uterus. Hysteroscopic sterilization In this method, a small, flexible insert is placed into each fallopian tube. The inserts cause scar tissue to form in the fallopian tubes and block them, so sperm cannot reach an egg. The procedure takes about 3 months to be effective. Another form of birth control must be used during those 3 months. Female sterilization This is a procedure to tie off the tubes that carry sperm (vasectomy). After the procedure, the man can still ejaculate fluid (semen). Natural planning methods Natural family planning In this method, a couple does not have sex on days when the woman could become pregnant. Calendar method This means keeping track of the length of each menstrual cycle, identifying the days when pregnancy can happen, and not having sex on those days. Ovulation method In this method, a couple avoids sex during ovulation. Symptothermal method This method involves not having sex during ovulation. The woman typically checks for ovulation by watching changes in her temperature and in the consistency of cervical mucus. Post-ovulation method In this method, a couple waits to have sex until after ovulation. Summary  Contraception, also called birth control, means methods or devices that prevent pregnancy.  Hormonal methods of contraception include implants, injections, pills, patches, vaginal rings, and emergency contraceptives.  Barrier methods of contraception can include female condoms, female condoms, diaphragms, cervical caps, sponges, and spermicides.  There are two types of IUDs (intrauterine devices). An IUD can be put in a  woman's uterus to prevent pregnancy for 3-5 years.  Permanent sterilization can be done through a procedure for males, females, or both.  Natural family planning methods involve not having sex on days when the woman could become pregnant. This information is not intended to replace advice given to you by your health care provider. Make sure you discuss any questions you have with your health care provider. Document Released: 06/11/2005 Document Revised: 07/14/2016 Document Reviewed: 07/14/2016 Elsevier Interactive Patient Education  2018 [employer]

## 2017-09-11 NOTE — Progress Notes (Signed)
CSW A.Linton Rump met with pt privately to discuss any parenting concerns, mental health and family planning. Pt reports she is [facility] residing [facility] and receiving case management and social supports. Pt reports she is interested in obtaining nexaplon before she is discharged from the hospital. CSW A. Linton Rump met with pt to develop rapport and plans to meet with pt again before delivery.

## 2017-09-11 NOTE — Progress Notes (Signed)
   PRENATAL VISIT NOTE  Subjective:  Anita Love is a 22 y.o. G1P0 at 2989w6d being seen today for ongoing prenatal care.  She is currently monitored for the following issues for this low-risk pregnancy and has Encounter for supervision of normal pregnancy and High risk teen pregnancy on their problem list.  Patient reports no complaints.  Contractions: Not present. Vag. Bleeding: None.  Movement: Present. Denies leaking of fluid.   The following portions of the patient's history were reviewed and updated as appropriate: allergies, current medications, past family history, past medical history, past social history, past surgical history and problem list. Problem list updated.  Objective:   Vitals:   09/11/17 1147  BP: 111/65  Pulse: 95  Weight: 136 lb 1.6 oz (61.7 kg)    Fetal Status: Fetal Heart Rate (bpm): 145   Movement: Present     General:  Alert, oriented and cooperative. Patient is in no acute distress.  Skin: Skin is warm and dry. No rash noted.   Cardiovascular: Normal heart rate noted  Respiratory: Normal respiratory effort, no problems with respiration noted  Abdomen: Soft, gravid, appropriate for gestational age.  Pain/Pressure: Absent     Pelvic: Cervical exam deferred        Extremities: Normal range of motion.  Edema: None  Mental Status:  Normal mood and affect. Normal behavior. Normal judgment and thought content.   Assessment and Plan:  Pregnancy: G1P0 at 9589w6d  1. Supervision of normal first teen pregnancy in third trimester - Routine care  Preterm labor symptoms and general obstetric precautions including but not limited to vaginal bleeding, contractions, leaking of fluid and fetal movement were reviewed in detail with the patient. Please refer to After Visit Summary for other counseling recommendations.  Return in about 2 weeks (around 09/25/2017).   Thressa ShellerHeather Hogan, CNM

## 2017-09-12 ENCOUNTER — Telehealth: Payer: Self-pay | Admitting: Medical

## 2017-09-12 DIAGNOSIS — A084 Viral intestinal infection, unspecified: Secondary | ICD-10-CM

## 2017-09-12 MED ORDER — ONDANSETRON HCL 4 MG PO TABS: 4.0000 mg | ORAL_TABLET | Freq: Four times a day (QID) | ORAL | 0 refills | Status: DC

## 2017-09-12 NOTE — Addendum Note (Signed)
Addended by: Jill SideAY, Dalbert Stillings L on: 09/12/2017 12:25 PM   Modules accepted: Orders

## 2017-09-12 NOTE — Telephone Encounter (Signed)
Patient called office complaining of N/V x 2 days. Multiple sick contact at work recently. Advised of warning symptoms to go to MAU including contractions, bleeding, LOF or decreased FM. Patient would like Rx for Zofran to try to manage symptoms at home for now. Rx sent to pharmacy of choice. Patient voiced understanding. All questions answered.   Marny LowensteinWenzel, Julie N, PA-C 09/12/2017 9:05 AM

## 2017-09-16 ENCOUNTER — Telehealth: Payer: Self-pay | Admitting: General Practice

## 2017-09-16 NOTE — Telephone Encounter (Signed)
Patient called and left message on nurse line stating a prescription for nausea medication [facility]. Called patient and she states she was able to get her Rx. Patient had no questions

## 2017-09-24 ENCOUNTER — Telehealth: Payer: Self-pay | Admitting: Licensed Clinical Social Worker

## 2017-09-24 NOTE — Telephone Encounter (Signed)
Pt confirmed scheduled appt

## 2017-09-25 ENCOUNTER — Ambulatory Visit (INDEPENDENT_AMBULATORY_CARE_PROVIDER_SITE_OTHER): Payer: Medicaid Other | Admitting: Nurse Practitioner

## 2017-09-25 VITALS — BP 111/68 | HR 99 | Wt 137.3 lb

## 2017-09-25 DIAGNOSIS — Z3403 Encounter for supervision of normal first pregnancy, third trimester: Secondary | ICD-10-CM

## 2017-09-25 DIAGNOSIS — Z3400 Encounter for supervision of normal first pregnancy, unspecified trimester: Secondary | ICD-10-CM

## 2017-09-25 NOTE — Patient Instructions (Signed)
Braxton Hicks Contractions °Contractions of the uterus can occur throughout pregnancy, but they are not always a sign that you are in labor. You may have practice contractions called Braxton Hicks contractions. These false labor contractions are sometimes confused with true labor. °What are Braxton Hicks contractions? °Braxton Hicks contractions are tightening movements that occur in the muscles of the uterus before labor. Unlike true labor contractions, these contractions do not result in opening (dilation) and thinning of the cervix. Toward the end of pregnancy (32-34 weeks), Braxton Hicks contractions can happen more often and may become stronger. These contractions are sometimes difficult to tell apart from true labor because they can be very uncomfortable. You should not feel embarrassed if you go to the hospital with false labor. °Sometimes, the only way to tell if you are in true labor is for your health care provider to look for changes in the cervix. The health care provider will do a physical exam and may monitor your contractions. If you are not in true labor, the exam should show that your cervix is not dilating and your water has not broken. °If there are other health problems associated with your pregnancy, it is completely safe for you to be sent home with false labor. You may continue to have Braxton Hicks contractions until you go into true labor. °How to tell the difference between true labor and false labor °True labor °· Contractions last 30-70 seconds. °· Contractions become very regular. °· Discomfort is usually felt in the top of the uterus, and it spreads to the lower abdomen and low back. °· Contractions do not go away with walking. °· Contractions usually become more intense and increase in frequency. °· The cervix dilates and gets thinner. °False labor °· Contractions are usually shorter and not as strong as true labor contractions. °· Contractions are usually irregular. °· Contractions  are often felt in the front of the lower abdomen and in the groin. °· Contractions may go away when you walk around or change positions while lying down. °· Contractions get weaker and are shorter-lasting as time goes on. °· The cervix usually does not dilate or become thin. °Follow these instructions at home: °· Take over-the-counter and prescription medicines only as told by your health care provider. °· Keep up with your usual exercises and follow other instructions from your health care provider. °· Eat and drink lightly if you think you are going into labor. °· If Braxton Hicks contractions are making you uncomfortable: °? Change your position from lying down or resting to walking, or change from walking to resting. °? Sit and rest in a tub of warm water. °? Drink enough fluid to keep your urine pale yellow. Dehydration may cause these contractions. °? Do slow and deep breathing several times an hour. °· Keep all follow-up prenatal visits as told by your health care provider. This is important. °Contact a health care provider if: °· You have a fever. °· You have continuous pain in your abdomen. °Get help right away if: °· Your contractions become stronger, more regular, and closer together. °· You have fluid leaking or gushing from your vagina. °· You pass blood-tinged mucus (bloody show). °· You have bleeding from your vagina. °· You have low back pain that you never had before. °· You feel your baby’s head pushing down and causing pelvic pressure. °· Your baby is not moving inside you as much as it used to. °Summary °· Contractions that occur before labor are called Braxton   Hicks contractions, false labor, or practice contractions. °· Braxton Hicks contractions are usually shorter, weaker, farther apart, and less regular than true labor contractions. True labor contractions usually become progressively stronger and regular and they become more frequent. °· Manage discomfort from Braxton Hicks contractions by  changing position, resting in a warm bath, drinking plenty of water, or practicing deep breathing. °This information is not intended to replace advice given to you by your health care provider. Make sure you discuss any questions you have with your health care provider. °Document Released: 10/25/2016 Document Revised: 10/25/2016 Document Reviewed: 10/25/2016 °Elsevier Interactive Patient Education © 2018 Elsevier Inc. ° °

## 2017-09-25 NOTE — Progress Notes (Signed)
    Subjective:  Anita Love is a 22 y.o. G1P0 at [redacted]w[redacted]d being seen today for ongoing prenatal care.  She is currently monitored for the following issues for this low-risk pregnancy and has Encounter for supervision of normal pregnancy and High risk teen pregnancy on their problem list.  Patient reports no complaints.  Contractions: Irregular. Vag. Bleeding: None.  Movement: Present. Denies leaking of fluid.   The following portions of the patient's history were reviewed and updated as appropriate: allergies, current medications, past family history, past medical history, past social history, past surgical history and problem list. Problem list updated.  Objective:   Vitals:   09/25/17 1207  BP: 111/68  Pulse: 99  Weight: 137 lb 4.8 oz (62.3 kg)    Fetal Status: Fetal Heart Rate (bpm): 135 Fundal Height: 34 cm Movement: Present     General:  Alert, oriented and cooperative. Patient is in no acute distress.  Skin: Skin is warm and dry. No rash noted.   Cardiovascular: Normal heart rate noted  Respiratory: Normal respiratory effort, no problems with respiration noted  Abdomen: Soft, gravid, appropriate for gestational age. Pain/Pressure: Absent     Pelvic:  Cervical exam deferred        Extremities: Normal range of motion.  Edema: None  Mental Status: Normal mood and affect. Normal behavior. Normal judgment and thought content.   Urinalysis:      Assessment and Plan:  Pregnancy: G1P0 at [redacted]w[redacted]d  1. Supervision of normal first pregnancy, antepartum Doing well today  Preterm labor symptoms and general obstetric precautions including but not limited to vaginal bleeding, contractions, leaking of fluid and fetal movement were reviewed in detail with the patient. Please refer to After Visit Summary for other counseling recommendations.  Return in about 2 weeks (around 10/09/2017).  Nolene Bernheim, RN, MSN, NP-BC Nurse Practitioner, [facility] for [employer], [facility] 09/25/2017 12:27 PM

## 2017-10-08 ENCOUNTER — Inpatient Hospital Stay (HOSPITAL_COMMUNITY)
Admission: AD | Admit: 2017-10-08 | Discharge: 2017-10-09 | Disposition: A | Discharge status: Home / Self Care | Payer: Medicaid Other | Source: Ambulatory Visit | Attending: Obstetrics and Gynecology | Admitting: Obstetrics and Gynecology

## 2017-10-08 ENCOUNTER — Encounter (HOSPITAL_COMMUNITY): Payer: Self-pay | Admitting: *Deleted

## 2017-10-08 ENCOUNTER — Inpatient Hospital Stay (HOSPITAL_COMMUNITY): Payer: Medicaid Other

## 2017-10-08 DIAGNOSIS — O368930 Maternal care for other specified fetal problems, third trimester, not applicable or unspecified: Secondary | ICD-10-CM | POA: Diagnosis not present

## 2017-10-08 DIAGNOSIS — O26893 Other specified pregnancy related conditions, third trimester: Secondary | ICD-10-CM | POA: Diagnosis not present

## 2017-10-08 DIAGNOSIS — Z3A34 34 weeks gestation of pregnancy: Secondary | ICD-10-CM | POA: Diagnosis not present

## 2017-10-08 DIAGNOSIS — R102 Pelvic and perineal pain: Secondary | ICD-10-CM | POA: Diagnosis present

## 2017-10-08 DIAGNOSIS — O368390 Maternal care for abnormalities of the fetal heart rate or rhythm, unspecified trimester, not applicable or unspecified: Secondary | ICD-10-CM

## 2017-10-08 HISTORY — DX: Other specified health status: Z78.9

## 2017-10-08 MED ORDER — COMFORT FIT MATERNITY SUPP MED MISC: 1.0000 | Freq: Every day | 1 refills | Status: DC

## 2017-10-08 MED ORDER — TRAMADOL HCL 50 MG PO TABS
50.0000 mg | ORAL_TABLET | Freq: Once | ORAL | Status: AC
  Administered 2017-10-08: 50 mg via ORAL

## 2017-10-08 MED ORDER — TRAMADOL HCL 50 MG PO TABS
50.0000 mg | ORAL_TABLET | Freq: Once | ORAL | Status: DC
  Filled 2017-10-08 (×2): qty 1

## 2017-10-08 NOTE — Discharge Instructions (Signed)
Third Trimester of Pregnancy The third trimester is from week 28 through week 40 (months 7 through 9). The third trimester is a time when the unborn baby (fetus) is growing rapidly. At the end of the ninth month, the fetus is about 20 inches in length and weighs 6-10 pounds. Body changes during your third trimester Your body will continue to go through many changes during pregnancy. The changes vary from woman to woman. During the third trimester:  Your weight will continue to increase. You can expect to gain 25-35 pounds (11-16 kg) by the end of the pregnancy.  You may begin to get stretch marks on your hips, abdomen, and breasts.  You may urinate more often because the fetus is moving lower into your pelvis and pressing on your bladder.  You may develop or continue to have heartburn. This is caused by increased hormones that slow down muscles in the digestive tract.  You may develop or continue to have constipation because increased hormones slow digestion and cause the muscles that push waste through your intestines to relax.  You may develop hemorrhoids. These are swollen veins (varicose veins) in the rectum that can itch or be painful.  You may develop swollen, bulging veins (varicose veins) in your legs.  You may have increased body aches in the pelvis, back, or thighs. This is due to weight gain and increased hormones that are relaxing your joints.  You may have changes in your hair. These can include thickening of your hair, rapid growth, and changes in texture. Some women also have hair loss during or after pregnancy, or hair that feels dry or thin. Your hair will most likely return to normal after your baby is born.  Your breasts will continue to grow and they will continue to become tender. A yellow fluid (colostrum) may leak from your breasts. This is the first milk you are producing for your baby.  Your belly button may stick out.  You may notice more swelling in your hands,  face, or ankles.  You may have increased tingling or numbness in your hands, arms, and legs. The skin on your belly may also feel numb.  You may feel short of breath because of your expanding uterus.  You may have more problems sleeping. This can be caused by the size of your belly, increased need to urinate, and an increase in your body's metabolism.  You may notice the fetus "dropping," or moving lower in your abdomen (lightening).  You may have increased vaginal discharge.  You may notice your joints feel loose and you may have pain around your pelvic bone.  What to expect at prenatal visits You will have prenatal exams every 2 weeks until week 36. Then you will have weekly prenatal exams. During a routine prenatal visit:  You will be weighed to make sure you and the baby are growing normally.  Your blood pressure will be taken.  Your abdomen will be measured to track your baby's growth.  The fetal heartbeat will be listened to.  Any test results from the previous visit will be discussed.  You may have a cervical check near your due date to see if your cervix has softened or thinned (effaced).  You will be tested for Group B streptococcus. This happens between 35 and 37 weeks.  Your health care provider may ask you:  What your birth plan is.  How you are feeling.  If you are feeling the baby move.  If you have had   any abnormal symptoms, such as leaking fluid, bleeding, severe headaches, or abdominal cramping.  If you are using any tobacco products, including cigarettes, chewing tobacco, and electronic cigarettes.  If you have any questions.  Other tests or screenings that may be performed during your third trimester include:  Blood tests that check for low iron levels (anemia).  Fetal testing to check the health, activity level, and growth of the fetus. Testing is done if you have certain medical conditions or if there are problems during the  pregnancy.  Nonstress test (NST). This test checks the health of your baby to make sure there are no signs of problems, such as the baby not getting enough oxygen. During this test, a belt is placed around your belly. The baby is made to move, and its heart rate is monitored during movement.  What is false labor? False labor is a condition in which you feel small, irregular tightenings of the muscles in the womb (contractions) that usually go away with rest, changing position, or drinking water. These are called Braxton Hicks contractions. Contractions may last for hours, days, or even weeks before true labor sets in. If contractions come at regular intervals, become more frequent, increase in intensity, or become painful, you should see your health care provider. What are the signs of labor?  Abdominal cramps.  Regular contractions that start at 10 minutes apart and become stronger and more frequent with time.  Contractions that start on the top of the uterus and spread down to the lower abdomen and back.  Increased pelvic pressure and dull back pain.  A watery or bloody mucus discharge that comes from the vagina.  Leaking of amniotic fluid. This is also known as your "water breaking." It could be a slow trickle or a gush. Let your health care provider know if it has a color or strange odor. If you have any of these signs, call your health care provider right away, even if it is before your due date. Follow these instructions at home: Medicines  Follow your health care provider's instructions regarding medicine use. Specific medicines may be either safe or unsafe to take during pregnancy.  Take a prenatal vitamin that contains at least 600 micrograms (mcg) of folic acid.  If you develop constipation, try taking a stool softener if your health care provider approves. Eating and drinking  Eat a balanced diet that includes fresh fruits and vegetables, whole grains, good sources of protein  such as meat, eggs, or tofu, and low-fat dairy. Your health care provider will help you determine the amount of weight gain that is right for you.  Avoid raw meat and uncooked cheese. These carry germs that can cause birth defects in the baby.  If you have low calcium intake from food, talk to your health care provider about whether you should take a daily calcium supplement.  Eat four or five small meals rather than three large meals a day.  Limit foods that are high in fat and processed sugars, such as fried and sweet foods.  To prevent constipation: ? Drink enough fluid to keep your urine clear or pale yellow. ? Eat foods that are high in fiber, such as fresh fruits and vegetables, whole grains, and beans. Activity  Exercise only as directed by your health care provider. Most women can continue their usual exercise routine during pregnancy. Try to exercise for 30 minutes at least 5 days a week. Stop exercising if you experience uterine contractions.  Avoid heavy   lifting.  Do not exercise in extreme heat or humidity, or at high altitudes.  Wear low-heel, comfortable shoes.  Practice good posture.  You may continue to have sex unless your health care provider tells you otherwise. Relieving pain and discomfort  Take frequent breaks and rest with your legs elevated if you have leg cramps or low back pain.  Take warm sitz baths to soothe any pain or discomfort caused by hemorrhoids. Use hemorrhoid cream if your health care provider approves.  Wear a good support bra to prevent discomfort from breast tenderness.  If you develop varicose veins: ? Wear support pantyhose or compression stockings as told by your healthcare provider. ? Elevate your feet for 15 minutes, 3-4 times a day. Prenatal care  Write down your questions. Take them to your prenatal visits.  Keep all your prenatal visits as told by your health care provider. This is important. Safety  Wear your seat belt at  all times when driving.  Make a list of emergency phone numbers, including numbers for family, friends, the hospital, and police and fire departments. General instructions  Avoid cat litter boxes and soil used by cats. These carry germs that can cause birth defects in the baby. If you have a cat, ask someone to clean the litter box for you.  Do not travel far distances unless it is absolutely necessary and only with the approval of your health care provider.  Do not use hot tubs, steam rooms, or saunas.  Do not drink alcohol.  Do not use any products that contain nicotine or tobacco, such as cigarettes and e-cigarettes. If you need help quitting, ask your health care provider.  Do not use any medicinal herbs or unprescribed drugs. These chemicals affect the formation and growth of the baby.  Do not douche or use tampons or scented sanitary pads.  Do not cross your legs for long periods of time.  To prepare for the arrival of your baby: ? Take prenatal classes to understand, practice, and ask questions about labor and delivery. ? Make a trial run to the hospital. ? Visit the hospital and tour the maternity area. ? Arrange for maternity or paternity leave through employers. ? Arrange for family and friends to take care of pets while you are in the hospital. ? Purchase a rear-facing car seat and make sure you know how to install it in your car. ? Pack your hospital bag. ? Prepare the baby's nursery. Make sure to remove all pillows and stuffed animals from the baby's crib to prevent suffocation.  Visit your dentist if you have not gone during your pregnancy. Use a soft toothbrush to brush your teeth and be gentle when you floss. Contact a health care provider if:  You are unsure if you are in labor or if your water has broken.  You become dizzy.  You have mild pelvic cramps, pelvic pressure, or nagging pain in your abdominal area.  You have lower back pain.  You have persistent  nausea, vomiting, or diarrhea.  You have an unusual or bad smelling vaginal discharge.  You have pain when you urinate. Get help right away if:  Your water breaks before 37 weeks.  You have regular contractions less than 5 minutes apart before 37 weeks.  You have a fever.  You are leaking fluid from your vagina.  You have spotting or bleeding from your vagina.  You have severe abdominal pain or cramping.  You have rapid weight loss or weight gain.    You have shortness of breath with chest pain.  You notice sudden or extreme swelling of your face, hands, ankles, feet, or legs.  Your baby makes fewer than 10 movements in 2 hours.  You have severe headaches that do not go away when you take medicine.  You have vision changes. Summary  The third trimester is from week 28 through week 40, months 7 through 9. The third trimester is a time when the unborn baby (fetus) is growing rapidly.  During the third trimester, your discomfort may increase as you and your baby continue to gain weight. You may have abdominal, leg, and back pain, sleeping problems, and an increased need to urinate.  During the third trimester your breasts will keep growing and they will continue to become tender. A yellow fluid (colostrum) may leak from your breasts. This is the first milk you are producing for your baby.  False labor is a condition in which you feel small, irregular tightenings of the muscles in the womb (contractions) that eventually go away. These are called Braxton Hicks contractions. Contractions may last for hours, days, or even weeks before true labor sets in.  Signs of labor can include: abdominal cramps; regular contractions that start at 10 minutes apart and become stronger and more frequent with time; watery or bloody mucus discharge that comes from the vagina; increased pelvic pressure and dull back pain; and leaking of amniotic fluid. This information is not intended to replace advice  given to you by your health care provider. Make sure you discuss any questions you have with your health care provider. Document Released: 06/05/2001 Document Revised: 11/17/2015 Document Reviewed: 08/12/2012 Elsevier Interactive Patient Education  2017 Elsevier Inc.  

## 2017-10-08 NOTE — MAU Note (Signed)
Pt presents to MAU c/o pelvic pressure that started a couple of days ago and has worsened today. [facility] [place] lot [place]. Pt reports no bleeding or LOF. Pt reports +FM. Pt is having a hard time ambulating due to pain.

## 2017-10-08 NOTE — MAU Provider Note (Addendum)
Chief Complaint:  Pelvic Pain   First Provider Initiated Contact with Patient 10/08/17 2201     HPI: Anita Love is a 22 y.o. G1P0 at [id number] presents to maternity admissions reporting pelvic pain [place].  Does not think it is contractions.  "just hurts"  Hurts more when walking. .She reports good fetal movement, denies LOF, vaginal bleeding, vaginal itching/burning, urinary symptoms, h/a, dizziness, n/v, diarrhea, constipation or fever/chills.    Pelvic Pain  The patient's primary symptoms include pelvic pain. The patient's pertinent negatives include no genital itching, genital lesions, genital odor or vaginal bleeding. This is a new problem. The current episode started in the past 7 days. The problem occurs intermittently. The problem has been unchanged. She is pregnant. Associated symptoms include abdominal pain (more so pelvic pain, in bones). Pertinent negatives include no back pain, chills, constipation, diarrhea, dysuria, fever, nausea or vomiting. The symptoms are aggravated by activity. She has tried nothing for the symptoms.    RN Note Pt presents to MAU c/o pelvic pressure that started a couple of days ago and has worsened today. [facility] and [place] lot of walking. Pt reports no bleeding or LOF. Pt reports +FM. Pt is having a hard time ambulating due to pain.     Past Medical History: Past Medical History:  Diagnosis Date  . Medical history non-contributory     Past obstetric history: OB History  Gravida Para Term Preterm AB Living  1            SAB TAB Ectopic Multiple Live Births               # Outcome Date GA Lbr Len/2nd Weight Sex Delivery Anes PTL Lv  1 Current             Past Surgical History: Past Surgical History:  Procedure Laterality Date  . NO PAST SURGERIES      Family History: Family History  Problem Relation Age of Onset  . ADD / ADHD Neg Hx   . Anxiety disorder Neg Hx   . Cancer Neg  Hx   . Hypertension Neg Hx   . Miscarriages / Stillbirths Neg Hx   . Vision loss Neg Hx   . Varicose Veins Neg Hx   . Stroke Neg Hx   . Obesity Neg Hx   . Learning disabilities Neg Hx   . Kidney disease Neg Hx   . Intellectual disability Neg Hx   . Hyperlipidemia Neg Hx   . Heart disease Neg Hx   . Early death Neg Hx   . Hearing loss Neg Hx   . Drug abuse Neg Hx   . Diabetes Neg Hx   . Depression Neg Hx   . COPD Neg Hx   . Birth defects Neg Hx   . Asthma Neg Hx   . Arthritis Neg Hx   . Alcohol abuse Neg Hx     Social History: Social History   Tobacco Use  . Smoking status: Never Smoker  Substance Use Topics  . Alcohol use: No  . Drug use: No    Allergies: No Known Allergies  Meds:  Medications Prior to Admission  Medication Sig Dispense Refill Last Dose  . ondansetron (ZOFRAN) 4 MG tablet Take 1 tablet (4 mg total) by mouth every 6 (six) hours. 12 tablet 0 Past Month at Unknown time  . Prenatal Vit-Fe Fumarate-FA (PRENATAL MULTIVITAMIN) TABS tablet Take  1 tablet by mouth daily at 12 noon.   10/08/2017 at Unknown time  . ALPRAZolam (XANAX PO) Take 5 tablets by mouth once.   Not Taking  . naproxen sodium (ANAPROX) 220 MG tablet Take 440 mg by mouth 2 (two) times daily as needed (headaches).   Not Taking    I have reviewed patient's Past Medical Hx, Surgical Hx, Family Hx, Social Hx, medications and allergies.   ROS:  Review of Systems  Constitutional: Negative for chills and fever.  Gastrointestinal: Positive for abdominal pain (more so pelvic pain, in bones). Negative for constipation, diarrhea, nausea and vomiting.  Genitourinary: Positive for pelvic pain. Negative for dysuria.  Musculoskeletal: Negative for back pain.   Other systems negative  Physical Exam  No data found. Constitutional: Well-developed, well-nourished female in no acute distress.  Cardiovascular: normal rate and rhythm Respiratory: normal effort, clear to auscultation bilaterally GI: Abd  soft, non-tender, gravid appropriate for gestational age.   No rebound or guarding. MS: Extremities nontender, no edema, normal ROM Neurologic: Alert and oriented x 4.  GU: Neg CVAT.  PELVIC EXAM: Dilation: Closed Effacement (%): Thick Station: Ballotable Exam by:: Wynelle Bourgeois CNM     FHT:  Baseline 145 , moderate variability, accelerations present, no decelerations except for one variable, occurring spontaneously Contractions:  Irregular and infrequent   Labs: O/Positive/-- (01/18 1623) No results found for this or any previous visit (from the past 24 hour(s)).  Imaging:  BPP done due to single spontaneous variable deceleration AFI normal BPP 8/8 FHR reactive after [place]  MAU Course/MDM: NST reviewed and was reactive   She did have one variable decel so we did an [place] for BPP which was reassuring  Treatments in MAU included EFM, BPP.    Assessment: [place] IUP at [redacted]w[redacted]d Pelvic pain likely due to cartilage laxity and increased activity today Single FHR decel with subsequent reactive FHR, normal BPP, and normal fluid  Plan: Discharge home Preterm Labor precautions and fetal kick counts Rx Maternity Support Belt Follow up in Office for prenatal visits and recheck  Encouraged to return here or to other Urgent Care/ED if she develops worsening of symptoms, increase in pain, fever, or other concerning symptoms.   Pt stable at time of discharge.  Wynelle Bourgeois CNM, MSN Certified Nurse-Midwife 10/08/2017 10:01 PM

## 2017-10-09 DIAGNOSIS — Z3A34 34 weeks gestation of pregnancy: Secondary | ICD-10-CM

## 2017-10-09 DIAGNOSIS — O26893 Other specified pregnancy related conditions, third trimester: Secondary | ICD-10-CM | POA: Diagnosis not present

## 2017-10-09 DIAGNOSIS — R102 Pelvic and perineal pain: Secondary | ICD-10-CM

## 2017-10-10 ENCOUNTER — Other Ambulatory Visit (HOSPITAL_COMMUNITY)
Admission: RE | Admit: 2017-10-10 | Discharge: 2017-10-10 | Disposition: A | Discharge status: Home / Self Care | Payer: Medicaid Other | Source: Ambulatory Visit | Attending: Student | Admitting: Student

## 2017-10-10 ENCOUNTER — Ambulatory Visit (INDEPENDENT_AMBULATORY_CARE_PROVIDER_SITE_OTHER): Payer: Medicaid Other | Admitting: Student

## 2017-10-10 VITALS — BP 105/73 | HR 92 | Wt 142.6 lb

## 2017-10-10 DIAGNOSIS — Z3400 Encounter for supervision of normal first pregnancy, unspecified trimester: Secondary | ICD-10-CM

## 2017-10-10 DIAGNOSIS — Z3403 Encounter for supervision of normal first pregnancy, third trimester: Secondary | ICD-10-CM

## 2017-10-10 LAB — OB RESULTS CONSOLE GC/CHLAMYDIA: Gonorrhea: NEGATIVE

## 2017-10-10 LAB — OB RESULTS CONSOLE GBS: GBS: NEGATIVE

## 2017-10-10 NOTE — Patient Instructions (Signed)

## 2017-10-10 NOTE — Progress Notes (Signed)
   PRENATAL VISIT NOTE  Subjective:  Anita Love is a 22 y.o. G1P0 at 1522w0d being seen today for ongoing prenatal care.  She is currently monitored for the following issues for this low-risk pregnancy and has Encounter for supervision of normal pregnancy and High risk teen pregnancy on their problem list.  Patient reports pelvic pain. .  Contractions: Irregular. Vag. Bleeding: None.  Movement: Present. Denies leaking of fluid.   The following portions of the patient's history were reviewed and updated as appropriate: allergies, current medications, past family history, past medical history, past social history, past surgical history and problem list. Problem list updated.  Objective:   Vitals:   10/10/17 1557  BP: 105/73  Pulse: 92  Weight: 142 lb 9.6 oz (64.7 kg)    Fetal Status: Fetal Heart Rate (bpm): 134  Fundal Height: 35 cm Movement: Present     General:  Alert, oriented and cooperative. Patient is in no acute distress.  Skin: Skin is warm and dry. No rash noted.   Cardiovascular: Normal heart rate noted  Respiratory: Normal respiratory effort, no problems with respiration noted  Abdomen: Soft, gravid, appropriate for gestational age.  Pain/Pressure: Present     Pelvic: Cervical exam deferred        Extremities: Normal range of motion.  Edema: Trace  Mental Status: Normal mood and affect. Normal behavior. Normal judgment and thought content.   Assessment and Plan:  Pregnancy: G1P0 at 3322w0d  1. Supervision of normal first pregnancy, antepartum -Reviewed normalcy of pelvic pain or pressure at this point in pregnancy. Reviewed warning signs and when to come to MAU.  -Discussed breast feeding  tips - Culture, beta strep (group b only) - GC/Chlamydia probe amp ([place])[facility]  Preterm labor symptoms and general obstetric precautions including but not limited to vaginal bleeding, contractions, leaking of fluid and fetal movement were reviewed in detail with the  patient. Please refer to After Visit Summary for other counseling recommendations.  Return in about 1 week (around 10/17/2017).  Future Appointments  Date Time Provider Department Center  10/18/2017  1:35 PM Armando ReichertHogan, Heather D, CNM [facility]    Charlesetta GaribaldiKathryn Lorraine LynwoodKooistra, [place]

## 2017-10-11 LAB — GC/CHLAMYDIA PROBE AMP (~~LOC~~) NOT AT ARMC
Chlamydia: NEGATIVE
NEISSERIA GONORRHEA: NEGATIVE

## 2017-10-13 ENCOUNTER — Inpatient Hospital Stay (HOSPITAL_COMMUNITY)
Admission: AD | Admit: 2017-10-13 | Discharge: 2017-10-13 | Disposition: A | Discharge status: Home / Self Care | Payer: Medicaid Other | Source: Ambulatory Visit | Attending: Family Medicine | Admitting: Family Medicine

## 2017-10-13 ENCOUNTER — Other Ambulatory Visit: Payer: Self-pay | Admitting: Student

## 2017-10-13 ENCOUNTER — Encounter (HOSPITAL_COMMUNITY): Payer: Self-pay

## 2017-10-13 DIAGNOSIS — N898 Other specified noninflammatory disorders of vagina: Secondary | ICD-10-CM | POA: Diagnosis present

## 2017-10-13 DIAGNOSIS — O26893 Other specified pregnancy related conditions, third trimester: Secondary | ICD-10-CM | POA: Insufficient documentation

## 2017-10-13 DIAGNOSIS — O479 False labor, unspecified: Secondary | ICD-10-CM

## 2017-10-13 DIAGNOSIS — Z3A35 35 weeks gestation of pregnancy: Secondary | ICD-10-CM | POA: Diagnosis not present

## 2017-10-13 DIAGNOSIS — O4703 False labor before 37 completed weeks of gestation, third trimester: Secondary | ICD-10-CM

## 2017-10-13 DIAGNOSIS — O4700 False labor before 37 completed weeks of gestation, unspecified trimester: Secondary | ICD-10-CM

## 2017-10-13 LAB — WET PREP, GENITAL
Clue Cells Wet Prep HPF POC: NONE SEEN
Sperm: NONE SEEN
Trich, Wet Prep: NONE SEEN
YEAST WET PREP: NONE SEEN

## 2017-10-13 LAB — URINALYSIS, ROUTINE W REFLEX MICROSCOPIC
Bilirubin Urine: NEGATIVE
GLUCOSE, UA: NEGATIVE mg/dL
HGB URINE DIPSTICK: NEGATIVE
KETONES UR: NEGATIVE mg/dL
Leukocytes, UA: NEGATIVE
Nitrite: NEGATIVE
PROTEIN: NEGATIVE mg/dL
Specific Gravity, Urine: 1.01 (ref 1.005–1.030)
pH: 7 (ref 5.0–8.0)

## 2017-10-13 LAB — AMNISURE RUPTURE OF MEMBRANE (ROM) NOT AT ARMC: Amnisure ROM: NEGATIVE

## 2017-10-13 MED ORDER — BETAMETHASONE SOD PHOS & ACET 6 (3-3) MG/ML IJ SUSP: 12.0000 mg | Freq: Once | INTRAMUSCULAR | Status: DC

## 2017-10-13 MED ORDER — BETAMETHASONE SOD PHOS & ACET 6 (3-3) MG/ML IJ SUSP
12.0000 mg | INTRAMUSCULAR | Status: DC
  Administered 2017-10-13: 12 mg via INTRAMUSCULAR
  Filled 2017-10-13: qty 2

## 2017-10-13 NOTE — MAU Provider Note (Addendum)
Patient Anita Love is a 22 y.o. G1P0 at 5059w3d here with complaints of vaginal leaking and decreased fetal movements. She denies vaginal bleeding, dysuria, NV, HA or blurry vision. She has had on-going pelvic pain and discomfort; she was seen in the MAU for this complaint on 10-08-2017.   History     CSN: [id number]  Arrival date and time: 10/13/17 [id number]   First Provider Initiated Contact with Patient 10/13/17 1039      Chief Complaint  Patient presents with  . Rupture of Membranes   Vaginal Discharge  The patient's primary symptoms include vaginal discharge. The patient's pertinent negatives include no genital itching, genital lesions or genital odor. This is a new problem. The current episode started today. The problem occurs rarely. The problem has been resolved. Pertinent negatives include no back pain, constipation, headaches, urgency or vomiting. The vaginal discharge was clear. There has been no bleeding. She has not been passing clots. She has not been passing tissue.   Patient states that she was lying on her bed last night and she felt wetness on her pajama pants. She denies odor, blood or mucous. It happened one time.   OB History    Gravida  1   Para      Term      Preterm      AB      Living        SAB      TAB      Ectopic      Multiple      Live Births              Past Medical History:  Diagnosis Date  . Medical history non-contributory     Past Surgical History:  Procedure Laterality Date  . NO PAST SURGERIES      Family History  Problem Relation Age of Onset  . ADD / ADHD Neg Hx   . Anxiety disorder Neg Hx   . Cancer Neg Hx   . Hypertension Neg Hx   . Miscarriages / Stillbirths Neg Hx   . Vision loss Neg Hx   . Varicose Veins Neg Hx   . Stroke Neg Hx   . Obesity Neg Hx   . Learning disabilities Neg Hx   . Kidney disease Neg Hx   . Intellectual disability Neg Hx   . Hyperlipidemia Neg Hx   . Heart disease Neg Hx   . Early  death Neg Hx   . Hearing loss Neg Hx   . Drug abuse Neg Hx   . Diabetes Neg Hx   . Depression Neg Hx   . COPD Neg Hx   . Birth defects Neg Hx   . Asthma Neg Hx   . Arthritis Neg Hx   . Alcohol abuse Neg Hx     Social History   Tobacco Use  . Smoking status: Never Smoker  . Smokeless tobacco: Never Used  Substance Use Topics  . Alcohol use: No  . Drug use: No    Allergies: No Known Allergies  Medications Prior to Admission  Medication Sig Dispense Refill Last Dose  . Prenatal Vit-Fe Fumarate-FA (PRENATAL MULTIVITAMIN) TABS tablet Take 1 tablet by mouth daily at 12 noon.   10/12/2017 at Unknown time  . Elastic Bandages & Supports (COMFORT FIT MATERNITY SUPP MED) MISC 1 Device by Does not apply route daily. (Patient not taking: Reported on 10/10/2017) 1 each 1 Not Taking  . ondansetron (ZOFRAN) 4 MG tablet Take  1 tablet (4 mg total) by mouth every 6 (six) hours. (Patient not taking: Reported on 10/13/2017) 12 tablet 0 Not Taking at Unknown time    Review of Systems  Gastrointestinal: Negative for constipation and vomiting.  Genitourinary: Positive for vaginal discharge. Negative for urgency.  Musculoskeletal: Negative for back pain.  Neurological: Negative for headaches.   Physical Exam   Blood pressure 120/67, pulse 97, temperature 97.7 F (36.5 C), temperature source Oral, resp. rate 18, weight 142 lb (64.4 kg), last menstrual period 02/07/2017.  Physical Exam  Constitutional: She appears well-developed.  HENT:  Head: Normocephalic.  Neck: Normal range of motion.  GI: Soft.  Genitourinary:  Genitourinary Comments: Niormal external female genitalia; no pooling on speculum exam. No CMT, suprapubic or adnexal tenderness. Cervix is 1 cm, thick and posterior.   Musculoskeletal: Normal range of motion.  Neurological: She is alert.  Skin: Skin is warm and dry.  Psychiatric: She has a normal mood and affect.    MAU Course  Procedures  MDM -Patient had picture of  leaking of fluid on her pajama bottoms. Substantial sized wet-area observed covering the seat of her pants; will do amnisure for verification.  -frequent contractions on the EFM that patient endorses feeling like "pressure", although does not endorse that they are "coming and going" -wet prep negative  -BMZ x 1, patient will return tomorrow for 2nd shot.  -Cervix unchanged after 1 hour of monitoring.  -NST: 135 bpm, mod var, present acel, neg decels, contractions q2-3 min  Assessment and Plan   1. Vaginal discharge   2. Preterm contractions    2. Patient stable for discharge with information on pre-term birth.  3. Plan to return to MAU on 10-14-2017 for 2nd BMZ shot.  4. Plan to keep appt at [facility] on 10[facility]18-2017.  5. Warning signs and when to return to MAU reviewed.  6. Patient and family verbalized understanding.   Charlesetta Garibaldi Kooistra 10/13/2017, 10:39 AM

## 2017-10-13 NOTE — Discharge Instructions (Signed)
°-  return to MAU on Monday, April 22 at 10 am for repeat shot to help baby's lungs develop  Preterm Labor and Birth Information Pregnancy normally lasts 39-41 weeks. Preterm labor is when labor starts early. It starts before you have been pregnant for 37 whole weeks. What are the risk factors for preterm labor? Preterm labor is more likely to occur in women who:  Have an infection while pregnant.  Have a cervix that is short.  Have gone into preterm labor before.  Have had surgery on their cervix.  Are younger than age 20.  Are older than age 20.  Are African American.  Are pregnant with two or more babies.  Take street drugs while pregnant.  Smoke while pregnant.  Do not gain enough weight while pregnant.  Got pregnant right after another pregnancy.  What are the symptoms of preterm labor? Symptoms of preterm labor include:  Cramps. The cramps may feel like the cramps some women get during their period. The cramps may happen with watery poop (diarrhea).  Pain in the belly (abdomen).  Pain in the lower back.  Regular contractions or tightening. It may feel like your belly is getting tighter.  Pressure in the lower belly that seems to get stronger.  More fluid (discharge) leaking from the vagina. The fluid may be watery or bloody.  Water breaking.  Why is it important to notice signs of preterm labor? Babies who are born early may not be fully developed. They have a higher chance for:  Long-term heart problems.  Long-term lung problems.  Trouble controlling body systems, like breathing.  Bleeding in the brain.  A condition called cerebral palsy.  Learning difficulties.  Death.  These risks are highest for babies who are born before 34 weeks of pregnancy. How is preterm labor treated? Treatment depends on:  How long you were pregnant.  Your condition.  The health of your baby.  Treatment may involve:  Having a stitch (suture) placed in your  cervix. When you give birth, your cervix opens so the baby can come out. The stitch keeps the cervix from opening too soon.  Staying at the hospital.  Taking or getting medicines, such as: ? Hormone medicines. ? Medicines to stop contractions. ? Medicines to help the babys lungs develop. ? Medicines to prevent your baby from having cerebral palsy.  What should I do if I am in preterm labor? If you think you are going into labor too soon, call your doctor right away. How can I prevent preterm labor?  Do not use any tobacco products. ? Examples of these are cigarettes, chewing tobacco, and e-cigarettes. ? If you need help quitting, ask your doctor.  Do not use street drugs.  Do not use any medicines unless you ask your doctor if they are safe for you.  Talk with your doctor before taking any herbal supplements.  Make sure you gain enough weight.  Watch for infection. If you think you might have an infection, get it checked right away.  If you have gone into preterm labor before, tell your doctor. This information is not intended to replace advice given to you by your health care provider. Make sure you discuss any questions you have with your health care provider. Document Released: 09/07/2008 Document Revised: 11/22/2015 Document Reviewed: 11/02/2015 Elsevier Interactive Patient Education  2018 [employer]

## 2017-10-13 NOTE — MAU Note (Signed)
Pt said she woke up and thought her water broke, had wet pants, no bleeding. Baby not moving as much this morning as usual, no contractions.

## 2017-10-14 ENCOUNTER — Inpatient Hospital Stay (HOSPITAL_COMMUNITY)
Admission: AD | Admit: 2017-10-14 | Discharge: 2017-10-14 | Disposition: A | Discharge status: Home / Self Care | Payer: Medicaid Other | Source: Ambulatory Visit | Attending: Obstetrics & Gynecology | Admitting: Obstetrics & Gynecology

## 2017-10-14 DIAGNOSIS — Z3A35 35 weeks gestation of pregnancy: Secondary | ICD-10-CM | POA: Diagnosis not present

## 2017-10-14 DIAGNOSIS — O4703 False labor before 37 completed weeks of gestation, third trimester: Secondary | ICD-10-CM | POA: Insufficient documentation

## 2017-10-14 LAB — CULTURE, BETA STREP (GROUP B ONLY): STREP GP B CULTURE: NEGATIVE

## 2017-10-14 MED ORDER — BETAMETHASONE SOD PHOS & ACET 6 (3-3) MG/ML IJ SUSP
12.0000 mg | Freq: Once | INTRAMUSCULAR | Status: AC
  Administered 2017-10-14: 12 mg via INTRAMUSCULAR
  Filled 2017-10-14: qty 2

## 2017-10-14 NOTE — MAU Note (Signed)
Pt reports increased pressure today and she thinks she lost her mucous plug.

## 2017-10-18 ENCOUNTER — Encounter: Payer: Self-pay | Admitting: Advanced Practice Midwife

## 2017-10-18 ENCOUNTER — Ambulatory Visit (INDEPENDENT_AMBULATORY_CARE_PROVIDER_SITE_OTHER): Payer: Medicaid Other | Admitting: Advanced Practice Midwife

## 2017-10-18 VITALS — BP 113/64 | HR 109 | Wt 144.2 lb

## 2017-10-18 DIAGNOSIS — Z3400 Encounter for supervision of normal first pregnancy, unspecified trimester: Secondary | ICD-10-CM

## 2017-10-18 DIAGNOSIS — Z3403 Encounter for supervision of normal first pregnancy, third trimester: Secondary | ICD-10-CM

## 2017-10-18 NOTE — Patient Instructions (Addendum)
Etonogestrel implant What is this medicine? ETONOGESTREL (et oh noe JES trel) is a contraceptive (birth control) device. It is used to prevent pregnancy. It can be used for up to 3 years. This medicine may be used for other purposes; ask your health care provider or pharmacist if you have questions. COMMON BRAND NAME(S): Implanon, Nexplanon What should I tell my health care provider before I take this medicine? They need to know if you have any of these conditions: -abnormal vaginal bleeding -blood vessel disease or blood clots -cancer of the breast, cervix, or liver -depression -diabetes -gallbladder disease -headaches -heart disease or recent heart attack -high blood pressure -high cholesterol -kidney disease -liver disease -renal disease -seizures -tobacco smoker -an unusual or allergic reaction to etonogestrel, other hormones, anesthetics or antiseptics, medicines, foods, dyes, or preservatives -pregnant or trying to get pregnant -breast-feeding How should I use this medicine? This device is inserted just under the skin on the inner side of your upper arm by a health care professional. Talk to your pediatrician regarding the use of this medicine in children. Special care may be needed. Overdosage: If you think you have taken too much of this medicine contact a poison control center or emergency room at once. NOTE: This medicine is only for you. Do not share this medicine with others. What if I miss a dose? This does not apply. What may interact with this medicine? Do not take this medicine with any of the following medications: -amprenavir -bosentan -fosamprenavir This medicine may also interact with the following medications: -barbiturate medicines for inducing sleep or treating seizures -certain medicines for fungal infections like ketoconazole and itraconazole -grapefruit juice -griseofulvin -medicines to treat seizures like carbamazepine, felbamate, oxcarbazepine,  phenytoin, topiramate -modafinil -phenylbutazone -rifampin -rufinamide -some medicines to treat HIV infection like atazanavir, indinavir, lopinavir, nelfinavir, tipranavir, ritonavir -St. John's wort This list may not describe all possible interactions. Give your health care provider a list of all the medicines, herbs, non-prescription drugs, or dietary supplements you use. Also tell them if you smoke, drink alcohol, or use illegal drugs. Some items may interact with your medicine. What should I watch for while using this medicine? This product does not protect you against HIV infection (AIDS) or other sexually transmitted diseases. You should be able to feel the implant by pressing your fingertips over the skin where it was inserted. Contact your doctor if you cannot feel the implant, and use a non-hormonal birth control method (such as condoms) until your doctor confirms that the implant is in place. If you feel that the implant may have broken or become bent while in your arm, contact your healthcare provider. What side effects may I notice from receiving this medicine? Side effects that you should report to your doctor or health care professional as soon as possible: -allergic reactions like skin rash, itching or hives, swelling of the face, lips, or tongue -breast lumps -changes in emotions or moods -depressed mood -heavy or prolonged menstrual bleeding -pain, irritation, swelling, or bruising at the insertion site -scar at site of insertion -signs of infection at the insertion site such as fever, and skin redness, pain or discharge -signs of pregnancy -signs and symptoms of a blood clot such as breathing problems; changes in vision; chest pain; severe, sudden headache; pain, swelling, warmth in the leg; trouble speaking; sudden numbness or weakness of the face, arm or leg -signs and symptoms of liver injury like dark yellow or brown urine; general ill feeling or flu-like symptoms;  light-colored   stools; loss of appetite; nausea; right upper belly pain; unusually weak or tired; yellowing of the eyes or skin -unusual vaginal bleeding, discharge -signs and symptoms of a stroke like changes in vision; confusion; trouble speaking or understanding; severe headaches; sudden numbness or weakness of the face, arm or leg; trouble walking; dizziness; loss of balance or coordination Side effects that usually do not require medical attention (report to your doctor or health care professional if they continue or are bothersome): -acne -back pain -breast pain -changes in weight -dizziness -general ill feeling or flu-like symptoms -headache -irregular menstrual bleeding -nausea -sore throat -vaginal irritation or inflammation This list may not describe all possible side effects. Call your doctor for medical advice about side effects. You may report side effects to FDA at 1-800-FDA-1088. Where should I keep my medicine? This drug is given in a hospital or clinic and will not be stored at home. NOTE: This sheet is a summary. It may not cover all possible information. If you have questions about this medicine, talk to your doctor, pharmacist, or health care provider.  2018 Elsevier/Gold Standard (2015-12-29 11:19:22)  AREA PEDIATRIC/FAMILY PRACTICE PHYSICIANS  [facility] [address] [place], [place]  [address] Phone - [phone]   Fax - [phone]  [facility] [address] [place], [place] [address] Phone - [phone]   Fax - [phone]  [facility] [address] [place], [place]  [address] Phone - [phone]   Fax - [phone]  [facility] [place] [facility] [address] [place], [place]  [address] Phone - [phone]   Fax - [phone]  [facility] [address] [place], [place]  [address] Phone - [phone]   Fax - [phone]  [facility] [address] [place], [place]  [address] Phone - [phone]   Fax - [phone]  [facility] [place] [address] [place], [place]  [address] Phone - [phone]   Fax - [phone]  [facility] AT [facility] [address] [place], [address] [place], [place]  [address] Phone - [phone]   Fax - [phone]  [facility] AT [facility] [address] [place], [place]  [address] Phone - [phone]   Fax - [phone] [facility] [place] [address] [place], [place]  [address] Phone - [phone]   Fax - [phone]  [facility] [address] [place], [place]  [address] Phone - [phone]   Fax - [phone]  [facility] [address], [address] [place], [place]  [address] Phone - [phone]   Fax - [phone]  [facility] [place] [address] [place], [place]  [address] Phone - [phone]   Fax - [phone]  [facility] [address] [place], [place]  [address] Phone - [phone]  [facility] [address] [place], [place]  [address] Phone - [phone]   Fax - [phone]  [facility] [address] [place], [place]  [address] Phone - [phone]   Fax - [phone]  [facility] [address] [place], [place]  [address] Phone - [phone]   Fax - [phone]  [facility] [address] [place], [place]  [address] Phone - [phone]   Fax - [phone]   [facility] [address] [place], [place]  [address] Phone -  [phone]   Fax - [phone]  [facility] [address] [place], [place]  [address] Phone - [phone]   Fax - [phone]  [facility] RUB[facility] [address] [place], [place]  [address] Phone - [phone]   Fax -  [phone]  [facility] [address] [place], [place]  [address] Phone - [phone]   Fax - [phone]  [facility] [address] [place], [place]  [address] Phone - [phone]   Fax - [phone] [facility] [place] [address] [place], [place]  [address] Phone - [phone]   Fax - [phone]  [facility] [place] [address] [place], [place]  [address] Phone - [phone]   Fax - [phone]  [facility] [place] [address] [place], [place]  [address] Phone - [phone]   Fax - [phone]  [facility] [place] Wyvonne Lenzharlene Flemming MD [address] [place] [address] Phone [phone]  Fax [phone]

## 2017-10-18 NOTE — Progress Notes (Signed)
   PRENATAL VISIT NOTE  Subjective:  Anita Love is a 22 y.o. G1P0 at 822w1d being seen today for ongoing prenatal care.  She is currently monitored for the following issues for this low-risk pregnancy and has Encounter for supervision of normal pregnancy and High risk teen pregnancy on their problem list.  Patient reports no complaints.  Contractions: Irregular. Vag. Bleeding: None.  Movement: Present. Denies leaking of fluid.   The following portions of the patient's history were reviewed and updated as appropriate: allergies, current medications, past family history, past medical history, past social history, past surgical history and problem list. Problem list updated.  Objective:   Vitals:   10/18/17 1343  BP: 113/64  Pulse: (!) 109  Weight: 144 lb 3.2 oz (65.4 kg)    Fetal Status: Fetal Heart Rate (bpm): 138 Fundal Height: 36 cm Movement: Present     General:  Alert, oriented and cooperative. Patient is in no acute distress.  Skin: Skin is warm and dry. No rash noted.   Cardiovascular: Normal heart rate noted  Respiratory: Normal respiratory effort, no problems with respiration noted  Abdomen: Soft, gravid, appropriate for gestational age.  Pain/Pressure: Present     Pelvic: Cervical exam performed Dilation: 1 Effacement (%): Thick Station: -2  Extremities: Normal range of motion.  Edema: Trace  Mental Status: Normal mood and affect. Normal behavior. Normal judgment and thought content.   Assessment and Plan:  Pregnancy: G1P0 at 7922w1d  1. Supervision of normal first pregnancy, antepartum - Routine care    Term labor symptoms and general obstetric precautions including but not limited to vaginal bleeding, contractions, leaking of fluid and fetal movement were reviewed in detail with the patient. Please refer to After Visit Summary for other counseling recommendations.  Return in about 1 week (around 10/25/2017).  No future appointments.  Thressa ShellerHeather Hogan, CNM

## 2017-10-24 ENCOUNTER — Telehealth: Payer: Self-pay | Admitting: Licensed Clinical Social Worker

## 2017-10-24 NOTE — Telephone Encounter (Signed)
CSW A. Felton Clinton contacted pt regarding scheduled appt for 10/25/2017. Pt confirmed appt

## 2017-10-25 ENCOUNTER — Ambulatory Visit (INDEPENDENT_AMBULATORY_CARE_PROVIDER_SITE_OTHER): Payer: Medicaid Other | Admitting: Student

## 2017-10-25 VITALS — BP 109/65 | HR 102 | Wt 146.0 lb

## 2017-10-25 DIAGNOSIS — Z3403 Encounter for supervision of normal first pregnancy, third trimester: Secondary | ICD-10-CM

## 2017-10-25 NOTE — Patient Instructions (Signed)
Vaginal Delivery Vaginal delivery means that you will give birth by pushing your baby out of your birth canal (vagina). A team of health care providers will help you before, during, and after vaginal delivery. Birth experiences are unique for every woman and every pregnancy, and birth experiences vary depending on where you choose to give birth. What should I do to prepare for my baby's birth? Before your baby is born, it is important to talk with your health care provider about:  Your labor and delivery preferences. These may include: ? Medicines that you may be given. ? How you will manage your pain. This might include non-medical pain relief techniques or injectable pain relief such as epidural analgesia. ? How you and your baby will be monitored during labor and delivery. ? Who may be in the labor and delivery room with you. ? Your feelings about surgical delivery of your baby (cesarean delivery, or C-section) if this becomes necessary. ? Your feelings about receiving donated blood through an IV tube (blood transfusion) if this becomes necessary.  Whether you are able: ? To take pictures or videos of the birth. ? To eat during labor and delivery. ? To move around, walk, or change positions during labor and delivery.  What to expect after your baby is born, such as: ? Whether delayed umbilical cord clamping and cutting is offered. ? Who will care for your baby right after birth. ? Medicines or tests that may be recommended for your baby. ? Whether breastfeeding is supported in your hospital or birth center. ? How long you will be in the hospital or birth center.  How any medical conditions you have may affect your baby or your labor and delivery experience.  To prepare for your baby's birth, you should also:  Attend all of your health care visits before delivery (prenatal visits) as recommended by your health care provider. This is important.  Prepare your home for your baby's  arrival. Make sure that you have: ? Diapers. ? Baby clothing. ? Feeding equipment. ? Safe sleeping arrangements for you and your baby.  Install a car seat in your vehicle. Have your car seat checked by a certified car seat installer to make sure that it is installed safely.  Think about who will help you with your new baby at home for at least the first several weeks after delivery.  What can I expect when I arrive at the birth center or hospital? Once you are in labor and have been admitted into the hospital or birth center, your health care provider may:  Review your pregnancy history and any concerns you have.  Insert an IV tube into one of your veins. This is used to give you fluids and medicines.  Check your blood pressure, pulse, temperature, and heart rate (vital signs).  Check whether your bag of water (amniotic sac) has broken (ruptured).  Talk with you about your birth plan and discuss pain control options.  Monitoring Your health care provider may monitor your contractions (uterine monitoring) and your baby's heart rate (fetal monitoring). You may need to be monitored:  Often, but not continuously (intermittently).  All the time or for long periods at a time (continuously). Continuous monitoring may be needed if: ? You are taking certain medicines, such as medicine to relieve pain or make your contractions stronger. ? You have pregnancy or labor complications.  Monitoring may be done by:  Placing a special stethoscope or a handheld monitoring device on your abdomen to   check your baby's heartbeat, and feeling your abdomen for contractions. This method of monitoring does not continuously record your baby's heartbeat or your contractions.  Placing monitors on your abdomen (external monitors) to record your baby's heartbeat and the frequency and length of contractions. You may not have to wear external monitors all the time.  Placing monitors inside of your uterus  (internal monitors) to record your baby's heartbeat and the frequency, length, and strength of your contractions. ? Your health care provider may use internal monitors if he or she needs more information about the strength of your contractions or your baby's heart rate. ? Internal monitors are put in place by passing a thin, flexible wire through your vagina and into your uterus. Depending on the type of monitor, it may remain in your uterus or on your baby's head until birth. ? Your health care provider will discuss the benefits and risks of internal monitoring with you and will ask for your permission before inserting the monitors.  Telemetry. This is a type of continuous monitoring that can be done with external or internal monitors. Instead of having to stay in bed, you are able to move around during telemetry. Ask your health care provider if telemetry is an option for you.  Physical exam Your health care provider may perform a physical exam. This may include:  Checking whether your baby is positioned: ? With the head toward your vagina (head-down). This is most common. ? With the head toward the top of your uterus (head-up or breech). If your baby is in a breech position, your health care provider may try to turn your baby to a head-down position so you can deliver vaginally. If it does not seem that your baby can be born vaginally, your provider may recommend surgery to deliver your baby. In rare cases, you may be able to deliver vaginally if your baby is head-up (breech delivery). ? Lying sideways (transverse). Babies that are lying sideways cannot be delivered vaginally.  Checking your cervix to determine: ? Whether it is thinning out (effacing). ? Whether it is opening up (dilating). ? How low your baby has moved into your birth canal.  What are the three stages of labor and delivery?  Normal labor and delivery is divided into the following three stages: Stage 1  Stage 1 is the  longest stage of labor, and it can last for hours or days. Stage 1 includes: ? Early labor. This is when contractions may be irregular, or regular and mild. Generally, early labor contractions are more than 10 minutes apart. ? Active labor. This is when contractions get longer, more regular, more frequent, and more intense. ? The transition phase. This is when contractions happen very close together, are very intense, and may last longer than during any other part of labor.  Contractions generally feel mild, infrequent, and irregular at first. They get stronger, more frequent (about every 2-3 minutes), and more regular as you progress from early labor through active labor and transition.  Many women progress through stage 1 naturally, but you may need help to continue making progress. If this happens, your health care provider may talk with you about: ? Rupturing your amniotic sac if it has not ruptured yet. ? Giving you medicine to help make your contractions stronger and more frequent.  Stage 1 ends when your cervix is completely dilated to 4 inches (10 cm) and completely effaced. This happens at the end of the transition phase. Stage 2  Once   your cervix is completely effaced and dilated to 4 inches (10 cm), you may start to feel an urge to push. It is common for the body to naturally take a rest before feeling the urge to push, especially if you received an epidural or certain other pain medicines. This rest period may last for up to 1-2 hours, depending on your unique labor experience.  During stage 2, contractions are generally less painful, because pushing helps relieve contraction pain. Instead of contraction pain, you may feel stretching and burning pain, especially when the widest part of your baby's head passes through the vaginal opening (crowning).  Your health care provider will closely monitor your pushing progress and your baby's progress through the vagina during stage 2.  Your  health care provider may massage the area of skin between your vaginal opening and anus (perineum) or apply warm compresses to your perineum. This helps it stretch as the baby's head starts to crown, which can help prevent perineal tearing. ? In some cases, an incision may be made in your perineum (episiotomy) to allow the baby to pass through the vaginal opening. An episiotomy helps to make the opening of the vagina larger to allow more room for the baby to fit through.  It is very important to breathe and focus so your health care provider can control the delivery of your baby's head. Your health care provider may have you decrease the intensity of your pushing, to help prevent perineal tearing.  After delivery of your baby's head, the shoulders and the rest of the body generally deliver very quickly and without difficulty.  Once your baby is delivered, the umbilical cord may be cut right away, or this may be delayed for 1-2 minutes, depending on your baby's health. This may vary among health care providers, hospitals, and birth centers.  If you and your baby are healthy enough, your baby may be placed on your chest or abdomen to help maintain the baby's temperature and to help you bond with each other. Some mothers and babies start breastfeeding at this time. Your health care team will dry your baby and help keep your baby warm during this time.  Your baby may need immediate care if he or she: ? Showed signs of distress during labor. ? Has a medical condition. ? Was born too early (prematurely). ? Had a bowel movement before birth (meconium). ? Shows signs of difficulty transitioning from being inside the uterus to being outside of the uterus. If you are planning to breastfeed, your health care team will help you begin a feeding. Stage 3  The third stage of labor starts immediately after the birth of your baby and ends after you deliver the placenta. The placenta is an organ that develops  during pregnancy to provide oxygen and nutrients to your baby in the womb.  Delivering the placenta may require some pushing, and you may have mild contractions. Breastfeeding can stimulate contractions to help you deliver the placenta.  After the placenta is delivered, your uterus should tighten (contract) and become firm. This helps to stop bleeding in your uterus. To help your uterus contract and to control bleeding, your health care provider may: ? Give you medicine by injection, through an IV tube, by mouth, or through your rectum (rectally). ? Massage your abdomen or perform a vaginal exam to remove any blood clots that are left in your uterus. ? Empty your bladder by placing a thin, flexible tube (catheter) into your bladder. ? Encourage   you to breastfeed your baby. After labor is over, you and your baby will be monitored closely to ensure that you are both healthy until you are ready to go home. Your health care team will teach you how to care for yourself and your baby. This information is not intended to replace advice given to you by your health care provider. Make sure you discuss any questions you have with your health care provider. Document Released: 03/20/2008 Document Revised: 12/30/2015 Document Reviewed: 06/26/2015 Elsevier Interactive Patient Education  2018 [employer]  

## 2017-10-26 NOTE — Progress Notes (Signed)
   PRENATAL VISIT NOTE  Subjective:  Anita Love is a 22 y.o. G1P0 at [redacted]w[redacted]d being seen today for ongoing prenatal care.  She is currently monitored for the following issues for this low-risk pregnancy and has Encounter for supervision of normal pregnancy and High risk teen pregnancy on their problem list.  Patient reports no complaints.  Contractions: Irregular.  .  Movement: Present. Denies leaking of fluid.   The following portions of the patient's history were reviewed and updated as appropriate: allergies, current medications, past family history, past medical history, past social history, past surgical history and problem list. Problem list updated.  Objective:   Vitals:   10/25/17 1612  BP: 109/65  Pulse: (!) 102  Weight: 146 lb (66.2 kg)    Fetal Status: Fetal Heart Rate (bpm): 159 Fundal Height: 39 cm Movement: Present     General:  Alert, oriented and cooperative. Patient is in no acute distress.  Skin: Skin is warm and dry. No rash noted.   Cardiovascular: Normal heart rate noted  Respiratory: Normal respiratory effort, no problems with respiration noted  Abdomen: Soft, gravid, appropriate for gestational age.  Pain/Pressure: Absent     Pelvic: Cervical exam performed Dilation: 1 Effacement (%): Thick    Extremities: Normal range of motion.  Edema: Trace  Mental Status: Normal mood and affect. Normal behavior. Normal judgment and thought content.   Assessment and Plan:  Pregnancy: G1P0 at [redacted]w[redacted]d  1. Encounter for supervision of normal first pregnancy in third trimester    2. .Patient doing well; reviewed term labor precautions.     Term labor symptoms and general obstetric precautions including but not limited to vaginal bleeding, contractions, leaking of fluid and fetal movement were reviewed in detail with the patient. Please refer to After Visit Summary for other counseling recommendations.  Return in about 1 week (around 11/01/2017), or LROB.  Future  Appointments  Date Time Provider Department Center  11/01/2017  3:35 PM Sharyon Cable, CNM [facility]    [facility] [place], [place]

## 2017-11-01 ENCOUNTER — Encounter: Payer: Self-pay | Admitting: Certified Nurse Midwife

## 2017-11-01 ENCOUNTER — Ambulatory Visit (INDEPENDENT_AMBULATORY_CARE_PROVIDER_SITE_OTHER): Payer: Medicaid Other | Admitting: Certified Nurse Midwife

## 2017-11-01 ENCOUNTER — Encounter: Payer: Medicaid Other | Admitting: Certified Nurse Midwife

## 2017-11-01 VITALS — BP 113/70 | HR 97 | Wt 148.6 lb

## 2017-11-01 DIAGNOSIS — O26893 Other specified pregnancy related conditions, third trimester: Secondary | ICD-10-CM

## 2017-11-01 DIAGNOSIS — O09893 Supervision of other high risk pregnancies, third trimester: Secondary | ICD-10-CM

## 2017-11-01 DIAGNOSIS — Z3403 Encounter for supervision of normal first pregnancy, third trimester: Secondary | ICD-10-CM

## 2017-11-01 DIAGNOSIS — R12 Heartburn: Secondary | ICD-10-CM

## 2017-11-01 MED ORDER — FAMOTIDINE 20 MG PO TABS: 20.0000 mg | ORAL_TABLET | Freq: Two times a day (BID) | ORAL | 0 refills | Status: DC

## 2017-11-01 NOTE — Progress Notes (Signed)
   PRENATAL VISIT NOTE  Subjective:  Anita Love is a 22 y.o. G1P0 at [redacted]w[redacted]d being seen today for ongoing prenatal care.  She is currently monitored for the following issues for this low-risk pregnancy and has Encounter for supervision of normal pregnancy and High risk teen pregnancy on their problem list.  Patient reports heartburn.  Contractions: Irregular. Vag. Bleeding: None.  Movement: Present. Denies leaking of fluid.   The following portions of the patient's history were reviewed and updated as appropriate: allergies, current medications, past family history, past medical history, past social history, past surgical history and problem list. Problem list updated.  Objective:   Vitals:   11/01/17 1318  BP: 113/70  Pulse: 97  Weight: 148 lb 9.6 oz (67.4 kg)    Fetal Status: Fetal Heart Rate (bpm): 144 Fundal Height: 37 cm Movement: Present     General:  Alert, oriented and cooperative. Patient is in no acute distress.  Skin: Skin is warm and dry. No rash noted.   Cardiovascular: Normal heart rate noted  Respiratory: Normal respiratory effort, no problems with respiration noted  Abdomen: Soft, gravid, appropriate for gestational age.  Pain/Pressure: Present     Pelvic: Cervical exam performed Dilation: 1 Effacement (%): Thick    Extremities: Normal range of motion.  Edema: Trace  Mental Status: Normal mood and affect. Normal behavior. Normal judgment and thought content.   Assessment and Plan:  Pregnancy: G1P0 at [redacted]w[redacted]d  1. Encounter for supervision of normal first pregnancy in third trimester -Patient doing well, complains of heartburn and being tired- ready for this pregnancy to be over.  -Answered patient's questions on induction. Discussed that induction is scheduled for 41 weeks as long as there is no medical concerns.  2. High risk teen pregnancy in third trimester   3. Heartburn during pregnancy in third trimester -Patient reports increased heartburn over the past 2  weeks, reports taking "Tums like candy". Advised to slow down on taking Tums and switch to a different medicine to see if heartburn gets better. Patient verbalizes understanding.  - famotidine (PEPCID) 20 MG tablet; Take 1 tablet (20 mg total) by mouth 2 (two) times daily.  Dispense: 30 tablet; Refill: 0   Term labor symptoms and general obstetric precautions including but not limited to vaginal bleeding, contractions, leaking of fluid and fetal movement were reviewed in detail with the patient. Please refer to After Visit Summary for other counseling recommendations.  Return in about 1 week (around 11/08/2017) for ROB.   Sharyon Cable, CNM

## 2017-11-02 ENCOUNTER — Inpatient Hospital Stay (HOSPITAL_COMMUNITY)
Admission: AD | Admit: 2017-11-02 | Discharge: 2017-11-02 | Disposition: A | Discharge status: Home / Self Care | Payer: Medicaid Other | Source: Ambulatory Visit | Attending: Obstetrics and Gynecology | Admitting: Obstetrics and Gynecology

## 2017-11-02 ENCOUNTER — Encounter (HOSPITAL_COMMUNITY): Payer: Self-pay | Admitting: *Deleted

## 2017-11-02 DIAGNOSIS — O471 False labor at or after 37 completed weeks of gestation: Secondary | ICD-10-CM

## 2017-11-02 DIAGNOSIS — O479 False labor, unspecified: Secondary | ICD-10-CM | POA: Insufficient documentation

## 2017-11-02 DIAGNOSIS — Z3A37 37 weeks gestation of pregnancy: Secondary | ICD-10-CM | POA: Insufficient documentation

## 2017-11-02 HISTORY — DX: Headache: R51

## 2017-11-02 HISTORY — DX: Headache, unspecified: R51.9

## 2017-11-02 NOTE — Discharge Instructions (Signed)
Braxton Hicks Contractions °Contractions of the uterus can occur throughout pregnancy, but they are not always a sign that you are in labor. You may have practice contractions called Braxton Hicks contractions. These false labor contractions are sometimes confused with true labor. °What are Braxton Hicks contractions? °Braxton Hicks contractions are tightening movements that occur in the muscles of the uterus before labor. Unlike true labor contractions, these contractions do not result in opening (dilation) and thinning of the cervix. Toward the end of pregnancy (32-34 weeks), Braxton Hicks contractions can happen more often and may become stronger. These contractions are sometimes difficult to tell apart from true labor because they can be very uncomfortable. You should not feel embarrassed if you go to the hospital with false labor. °Sometimes, the only way to tell if you are in true labor is for your health care provider to look for changes in the cervix. The health care provider will do a physical exam and may monitor your contractions. If you are not in true labor, the exam should show that your cervix is not dilating and your water has not broken. °If there are other health problems associated with your pregnancy, it is completely safe for you to be sent home with false labor. You may continue to have Braxton Hicks contractions until you go into true labor. °How to tell the difference between true labor and false labor °True labor °· Contractions last 30-70 seconds. °· Contractions become very regular. °· Discomfort is usually felt in the top of the uterus, and it spreads to the lower abdomen and low back. °· Contractions do not go away with walking. °· Contractions usually become more intense and increase in frequency. °· The cervix dilates and gets thinner. °False labor °· Contractions are usually shorter and not as strong as true labor contractions. °· Contractions are usually irregular. °· Contractions  are often felt in the front of the lower abdomen and in the groin. °· Contractions may go away when you walk around or change positions while lying down. °· Contractions get weaker and are shorter-lasting as time goes on. °· The cervix usually does not dilate or become thin. °Follow these instructions at home: °· Take over-the-counter and prescription medicines only as told by your health care provider. °· Keep up with your usual exercises and follow other instructions from your health care provider. °· Eat and drink lightly if you think you are going into labor. °· If Braxton Hicks contractions are making you uncomfortable: °? Change your position from lying down or resting to walking, or change from walking to resting. °? Sit and rest in a tub of warm water. °? Drink enough fluid to keep your urine pale yellow. Dehydration may cause these contractions. °? Do slow and deep breathing several times an hour. °· Keep all follow-up prenatal visits as told by your health care provider. This is important. °Contact a health care provider if: °· You have a fever. °· You have continuous pain in your abdomen. °Get help right away if: °· Your contractions become stronger, more regular, and closer together. °· You have fluid leaking or gushing from your vagina. °· You pass blood-tinged mucus (bloody show). °· You have bleeding from your vagina. °· You have low back pain that you never had before. °· You feel your baby’s head pushing down and causing pelvic pressure. °· Your baby is not moving inside you as much as it used to. °Summary °· Contractions that occur before labor are called Braxton   Hicks contractions, false labor, or practice contractions. °· Braxton Hicks contractions are usually shorter, weaker, farther apart, and less regular than true labor contractions. True labor contractions usually become progressively stronger and regular and they become more frequent. °· Manage discomfort from Braxton Hicks contractions by  changing position, resting in a warm bath, drinking plenty of water, or practicing deep breathing. °This information is not intended to replace advice given to you by your health care provider. Make sure you discuss any questions you have with your health care provider. °Document Released: 10/25/2016 Document Revised: 10/25/2016 Document Reviewed: 10/25/2016 °Elsevier Interactive Patient Education © 2018 [employer] ° °

## 2017-11-02 NOTE — MAU Note (Signed)
Ctxs since 2300. Closer and stronger now. Denies LOF or bleeding. 1cm last sve on Friday

## 2017-11-03 ENCOUNTER — Inpatient Hospital Stay (HOSPITAL_COMMUNITY)
Admission: AD | Admit: 2017-11-03 | Discharge: 2017-11-03 | Disposition: A | Discharge status: Home / Self Care | Payer: Medicaid Other | Source: Ambulatory Visit | Attending: Obstetrics & Gynecology | Admitting: Obstetrics & Gynecology

## 2017-11-03 ENCOUNTER — Other Ambulatory Visit: Payer: Self-pay

## 2017-11-03 ENCOUNTER — Encounter (HOSPITAL_COMMUNITY): Payer: Self-pay

## 2017-11-03 DIAGNOSIS — O471 False labor at or after 37 completed weeks of gestation: Secondary | ICD-10-CM | POA: Insufficient documentation

## 2017-11-03 DIAGNOSIS — Z3A38 38 weeks gestation of pregnancy: Secondary | ICD-10-CM | POA: Diagnosis not present

## 2017-11-03 DIAGNOSIS — O479 False labor, unspecified: Secondary | ICD-10-CM

## 2017-11-03 LAB — URINALYSIS, ROUTINE W REFLEX MICROSCOPIC
Bilirubin Urine: NEGATIVE
Glucose, UA: 50 mg/dL — AB
Hgb urine dipstick: NEGATIVE
KETONES UR: 20 mg/dL — AB
Leukocytes, UA: NEGATIVE
Nitrite: NEGATIVE
PROTEIN: 30 mg/dL — AB
Specific Gravity, Urine: 1.023 (ref 1.005–1.030)
pH: 5 (ref 5.0–8.0)

## 2017-11-03 MED ORDER — NALBUPHINE HCL 10 MG/ML IJ SOLN
10.0000 mg | Freq: Once | INTRAMUSCULAR | Status: AC
  Administered 2017-11-03: 10 mg via INTRAMUSCULAR
  Filled 2017-11-03: qty 1

## 2017-11-03 MED ORDER — PROMETHAZINE HCL 25 MG/ML IJ SOLN
12.5000 mg | Freq: Once | INTRAMUSCULAR | Status: AC
  Administered 2017-11-03: 12.5 mg via INTRAMUSCULAR
  Filled 2017-11-03: qty 1

## 2017-11-03 NOTE — MAU Provider Note (Signed)
Chief Complaint:  Abdominal Pain   None     HPI: Anita Love is a 22 y.o. G1P0 at [redacted]w[redacted]d who presents to maternity admissions reporting painful regular contractions since yesterday with onset of diarrhea last night.  She reports pain in her abdomen and back that is cramping intermittent pain every 5-10 minutes. It is associated with vaginal pressure and with watery diarrhea x 4-5 episodes. She denies sick contacts or fever.  There are no other associated symptoms. She has not tried any treatments. She reports good fetal movement, denies LOF, vaginal bleeding, vaginal itching/burning, urinary symptoms, h/a, dizziness, n/v, or fever/chills.    HPI  Past Medical History: Past Medical History:  Diagnosis Date  . Headache    no meds     Past obstetric history: OB History  Gravida Para Term Preterm AB Living  1            SAB TAB Ectopic Multiple Live Births               # Outcome Date GA Lbr Len/2nd Weight Sex Delivery Anes PTL Lv  1 Current             Past Surgical History: Past Surgical History:  Procedure Laterality Date  . NO PAST SURGERIES      Family History: Family History  Problem Relation Age of Onset  . ADD / ADHD Neg Hx   . Anxiety disorder Neg Hx   . Cancer Neg Hx   . Hypertension Neg Hx   . Miscarriages / Stillbirths Neg Hx   . Vision loss Neg Hx   . Varicose Veins Neg Hx   . Stroke Neg Hx   . Obesity Neg Hx   . Learning disabilities Neg Hx   . Kidney disease Neg Hx   . Intellectual disability Neg Hx   . Hyperlipidemia Neg Hx   . Heart disease Neg Hx   . Early death Neg Hx   . Hearing loss Neg Hx   . Drug abuse Neg Hx   . Diabetes Neg Hx   . Depression Neg Hx   . COPD Neg Hx   . Birth defects Neg Hx   . Asthma Neg Hx   . Arthritis Neg Hx   . Alcohol abuse Neg Hx     Social History: Social History   Tobacco Use  . Smoking status: Former Games developer  . Smokeless tobacco: Never Used  Substance Use Topics  . Alcohol use: No  . Drug use: No     Allergies: No Known Allergies  Meds:  Medications Prior to Admission  Medication Sig Dispense Refill Last Dose  . famotidine (PEPCID) 20 MG tablet Take 1 tablet (20 mg total) by mouth 2 (two) times daily. 30 tablet 0 11/01/2017 at Unknown time  . Prenatal Vit-Fe Fumarate-FA (PRENATAL MULTIVITAMIN) TABS tablet Take 1 tablet by mouth daily at 12 noon.   11/01/2017 at Unknown time    ROS:  Review of Systems  Constitutional: Negative for chills, fatigue and fever.  Eyes: Negative for visual disturbance.  Respiratory: Negative for shortness of breath.   Cardiovascular: Negative for chest pain.  Gastrointestinal: Negative for abdominal pain, nausea and vomiting.  Genitourinary: Negative for difficulty urinating, dysuria, flank pain, pelvic pain, vaginal bleeding, vaginal discharge and vaginal pain.  Neurological: Negative for dizziness and headaches.  Psychiatric/Behavioral: Negative.      I have reviewed patient's Past Medical Hx, Surgical Hx, Family Hx, Social Hx, medications and allergies.   Physical  Exam   Temp:  [97.9 F (36.6 C)] 97.9 F (36.6 C) (05/12 1820) Pulse Rate:  [92-111] 92 (05/12 1820) Resp:  [18] 18 (05/12 1820) BP: (115-116)/(63-78) 115/63 (05/12 1820) SpO2:  [95 %-100 %] 99 % (05/12 1840) Constitutional: Well-developed, well-nourished female in no mild distress.  Cardiovascular: normal rate Respiratory: normal effort GI: Abd soft between contractions, non-tender, gravid appropriate for gestational age.  MS: Extremities nontender, no edema, normal ROM Neurologic: Alert and oriented x 4.  GU: Neg CVAT.  Dilation: 3 Effacement (%): 50 Cervical Position: Posterior Station: -3 Presentation: Vertex Exam by:: Leftwhich CNM   FHT:  Baseline 145 , moderate variability, accelerations present, no decelerations Contractions: q 5-8 mins   Labs:  O/Positive/-- (01/18 1623) Results for orders placed or performed during the hospital encounter of 11/03/17 (from  the past 24 hour(s))  Urinalysis, Routine w reflex microscopic     Status: Abnormal   Collection Time: 11/03/17  4:21 PM  Result Value Ref Range   Color, Urine YELLOW YELLOW   APPearance HAZY (A) CLEAR   Specific Gravity, Urine 1.023 1.005 - 1.030   pH 5.0 5.0 - 8.0   Glucose, UA 50 (A) NEGATIVE mg/dL   Hgb urine dipstick NEGATIVE NEGATIVE   Bilirubin Urine NEGATIVE NEGATIVE   Ketones, ur 20 (A) NEGATIVE mg/dL   Protein, ur 30 (A) NEGATIVE mg/dL   Nitrite NEGATIVE NEGATIVE   Leukocytes, UA NEGATIVE NEGATIVE   RBC / HPF 0-5 0 - 5 RBC/hpf   WBC, UA 0-5 0 - 5 WBC/hpf   Bacteria, UA RARE (A) NONE SEEN   Squamous Epithelial / LPF 6-10 0 - 5   Mucus PRESENT    Imaging:    MAU Course/MDM: I have ordered labs and reviewed results.  NST reviewed and reactive Attempted to try maternal positions to improve fetal position/pain but pt did not tolerate hands and knees/knee chest position or left lateral position Pt walked x 45 minutes while in MAU Cervix unchanged in 2+ hours in MAU so no evidence of active labor No diarrhea during entire MAU stay, pt tolerating PO fluids well Therapeutic rest given with Nubain 10 mg and Phenergan 12.5 mg IM x 1 dose in MAU D/C home Labor precautions/reasons to return reviewed Keep scheduled appt on Friday in the office Pt discharge with strict labor precautions.    Assessment: 1. False labor     Plan: Discharge home Labor precautions and fetal kick counts   Sharen Counter Certified Nurse-Midwife 11/03/2017 5:55 PM

## 2017-11-03 NOTE — Progress Notes (Addendum)
G1 @ 38.[redacted] wksga. Here today dt dairhea that started right after being released from [facility] triage on 5/11 at 0200.   Also c/o lower abdominal and back pain rating 4-5 with  vaginal pressure.   Denies LOF or bleeding. +FM  VE: closed  1712: MD notified. Report given.   1747: VE 3/50/-3. Provider ok with pt not monitored. EFM taken off.  1750: pt positioned on all 4's  1807: pt desires to get in different position. Placed pt on lateral side with top leg draped over side of bed. Bedrails up for safety and support.   1812: pt c/o pain in the above position. Repositioned pt to left lateral with top leg draped over the other. States feels much better.   1815: pt having frequent ctx and palpating strong. Positioned to sitting position. EFM applied.   1850: Provider called to unit. Updated on pt status and ok for pt to walk if pt desires.   1905: pt ok to walk. Instructed to come back in an hour or sooner if pain is intolerable.   1950: provider at bs for VE. Unchanged EFM placed   2027: medicated per order.   2050: d/c instructions given with pt understanding. Assisted pt with putting on clothes and into wheelchair dt medication received. States pain level decreased from 10 to 4/10. Pt wheelchaired to car.

## 2017-11-06 ENCOUNTER — Telehealth: Payer: Self-pay

## 2017-11-06 NOTE — Telephone Encounter (Signed)
Pt called the front office and requested for someone to call her back she has some questions.

## 2017-11-07 ENCOUNTER — Telehealth (HOSPITAL_COMMUNITY): Payer: Self-pay | Admitting: *Deleted

## 2017-11-07 ENCOUNTER — Ambulatory Visit (INDEPENDENT_AMBULATORY_CARE_PROVIDER_SITE_OTHER): Payer: Medicaid Other | Admitting: Student

## 2017-11-07 VITALS — BP 117/75 | HR 89 | Wt 153.3 lb

## 2017-11-07 DIAGNOSIS — Z3403 Encounter for supervision of normal first pregnancy, third trimester: Secondary | ICD-10-CM

## 2017-11-07 NOTE — Telephone Encounter (Signed)
Preadmission screen  

## 2017-11-07 NOTE — Patient Instructions (Signed)
Before Baby Comes Home  Before your baby arrives it is important to:   Have all of the supplies that you will need to care for your baby.   Know where to go if there is an emergency.   Discuss the baby's arrival with other family members.    What supplies will I need?    It is recommended that you have the following supplies:  Large Items   Crib.   Crib mattress.   Rear-facing infant car seat. If possible, have a trained professional check to make sure that it is installed correctly.    Feeding   6-8 bottles that are 4-5 oz in size.   6-8 nipples.   Bottle brush.   Sterilizer, or a large pan or kettle with a lid.   A way to boil and cool water.   If you will be breastfeeding:  ? Breast pump.  ? Nipple cream.  ? Nursing bra.  ? Breast pads.  ? Breast shields.   If you will be formula feeding:  ? Formula.  ? Measuring cups.  ? Measuring spoons.    Bathing   Mild baby soap and baby shampoo.   Petroleum jelly.   Soft cloth towel and washcloth.   Hooded towel.   Cotton balls.   Bath basin.    Other Supplies   Rectal thermometer.   Bulb syringe.   Baby wipes or washcloths for diaper changes.   Diaper bag.   Changing pad.   Clothing, including one-piece outfits and pajamas.   Baby nail clippers.   Receiving blankets.   Mattress pad and sheets for the crib.   Night-light for the baby's room.   Baby monitor.   2 or 3 pacifiers.   Either 24-36 cloth diapers and waterproof diaper covers or a box of disposable diapers. You may need to use as many as 10-12 diapers per day.    How do I prepare for an emergency?  Prepare for an emergency by:   Knowing how to get to the nearest hospital.   Listing the phone numbers of your baby's health care providers near your home phone and in your cell phone.    How do I prepare my family?   Decide how to handle visitors.   If you have other children:  ? Talk with them about the baby coming home. Ask them how they feel about it.  ? Read a book together about  being a new big brother or sister.  ? Find ways to let them help you prepare for the new baby.  ? Have someone ready to care for them while you are in the hospital.  This information is not intended to replace advice given to you by your health care provider. Make sure you discuss any questions you have with your health care provider.  Document Released: 05/24/2008 Document Revised: 11/17/2015 Document Reviewed: 05/19/2014  Elsevier Interactive Patient Education  2018 Elsevier Inc.

## 2017-11-07 NOTE — Progress Notes (Signed)
   PRENATAL VISIT NOTE  Subjective:  Anita Love is a 22 y.o. G1P0 at [redacted]w[redacted]d being seen today for ongoing prenatal care.  She is currently monitored for the following issues for this low-risk pregnancy and has Encounter for supervision of normal pregnancy and High risk teen pregnancy on their problem list.  Patient reports occasional contractions and pelvic pressure.  Contractions: Irregular. Vag. Bleeding: None.  Movement: Present. Denies leaking of fluid.   The following portions of the patient's history were reviewed and updated as appropriate: allergies, current medications, past family history, past medical history, past social history, past surgical history and problem list. Problem list updated.  Objective:   Vitals:   11/07/17 1324  BP: 117/75  Pulse: 89  Weight: 153 lb 4.8 oz (69.5 kg)    Fetal Status: Fetal Heart Rate (bpm): 141 Fundal Height: 39 cm Movement: Present  Presentation: Vertex  General:  Alert, oriented and cooperative. Patient is in no acute distress.  Skin: Skin is warm and dry. No rash noted.   Cardiovascular: Normal heart rate noted  Respiratory: Normal respiratory effort, no problems with respiration noted  Abdomen: Soft, gravid, appropriate for gestational age.  Pain/Pressure: Present     Pelvic: Cervical exam performed Dilation: 3 Effacement (%): 50 Station: -3  Extremities: Normal range of motion.  Edema: Trace  Mental Status: Normal mood and affect. Normal behavior. Normal judgment and thought content.   Assessment and Plan:  Pregnancy: G1P0 at [redacted]w[redacted]d  1. Encounter for supervision of normal first pregnancy in third trimester -membrane sweep today -- discussed expectations with patient -Patient expressing frustration with irregular ctx, pelvic pressure, and being tired. Discussed IOL. Will schedule with her; earliest 5/28. Encouraged patient she is near the end. Hopefully will labor prior to IOL. Will see 1 more time before IOL & recollect GBS if still  pregnant.   Term labor symptoms and general obstetric precautions including but not limited to vaginal bleeding, contractions, leaking of fluid and fetal movement were reviewed in detail with the patient. Please refer to After Visit Summary for other counseling recommendations.  Return in about 1 week (around 11/14/2017) for Routine OB.  Future Appointments  Date Time Provider Department Center  11/19/2017  7:30 AM [facility] None    Judeth Horn, NP

## 2017-11-12 ENCOUNTER — Inpatient Hospital Stay (HOSPITAL_COMMUNITY)
Admission: AD | Admit: 2017-11-12 | Discharge: 2017-11-12 | Disposition: A | Discharge status: Home / Self Care | Payer: Medicaid Other | Source: Ambulatory Visit | Attending: Obstetrics and Gynecology | Admitting: Obstetrics and Gynecology

## 2017-11-12 ENCOUNTER — Encounter (HOSPITAL_COMMUNITY): Payer: Self-pay

## 2017-11-12 ENCOUNTER — Other Ambulatory Visit: Payer: Self-pay

## 2017-11-12 ENCOUNTER — Inpatient Hospital Stay (HOSPITAL_COMMUNITY)
Admission: AD | Admit: 2017-11-12 | Discharge: 2017-11-13 | Disposition: A | Discharge status: Home / Self Care | Payer: Medicaid Other | Source: Ambulatory Visit | Attending: Obstetrics and Gynecology | Admitting: Obstetrics and Gynecology

## 2017-11-12 ENCOUNTER — Ambulatory Visit (INDEPENDENT_AMBULATORY_CARE_PROVIDER_SITE_OTHER): Payer: Medicaid Other | Admitting: Obstetrics and Gynecology

## 2017-11-12 ENCOUNTER — Encounter (HOSPITAL_COMMUNITY): Payer: Self-pay | Admitting: *Deleted

## 2017-11-12 VITALS — BP 111/71 | HR 91 | Wt 153.3 lb

## 2017-11-12 DIAGNOSIS — O479 False labor, unspecified: Secondary | ICD-10-CM

## 2017-11-12 DIAGNOSIS — O471 False labor at or after 37 completed weeks of gestation: Secondary | ICD-10-CM

## 2017-11-12 DIAGNOSIS — Z3403 Encounter for supervision of normal first pregnancy, third trimester: Secondary | ICD-10-CM

## 2017-11-12 MED ORDER — NALBUPHINE HCL 10 MG/ML IJ SOLN
5.0000 mg | Freq: Once | INTRAMUSCULAR | Status: AC
  Administered 2017-11-12: 5 mg via INTRAMUSCULAR
  Filled 2017-11-12: qty 1

## 2017-11-12 MED ORDER — PROMETHAZINE HCL 25 MG/ML IJ SOLN
12.5000 mg | Freq: Once | INTRAMUSCULAR | Status: AC
  Administered 2017-11-12: 12.5 mg via INTRAMUSCULAR
  Filled 2017-11-12: qty 1

## 2017-11-12 NOTE — Patient Instructions (Signed)
Labor Induction Labor induction is when steps are taken to cause a pregnant woman to begin the labor process. Most women go into labor on their own between 37 weeks and 42 weeks of the pregnancy. When this does not happen or when there is a medical need, methods may be used to induce labor. Labor induction causes a pregnant woman's uterus to contract. It also causes the cervix to soften (ripen), open (dilate), and thin out (efface). Usually, labor is not induced before 39 weeks of the pregnancy unless there is a problem with the baby or mother. Before inducing labor, your health care provider will consider a number of factors, including the following:  The medical condition of you and the baby.  How many weeks along you are.  The status of the baby's lung maturity.  The condition of the cervix.  The position of the baby. What are the reasons for labor induction? Labor may be induced for the following reasons:  The health of the baby or mother is at risk.  The pregnancy is overdue by 1 week or more.  The water breaks but labor does not start on its own.  The mother has a health condition or serious illness, such as high blood pressure, infection, placental abruption, or diabetes.  The amniotic fluid amounts are low around the baby.  The baby is distressed. Convenience or wanting the baby to be born on a certain date is not a reason for inducing labor. What methods are used for labor induction? Several methods of labor induction may be used, such as:  Prostaglandin medicine. This medicine causes the cervix to dilate and ripen. The medicine will also start contractions. It can be taken by mouth or by inserting a suppository into the vagina.  Inserting a thin tube (catheter) with a balloon on the end into the vagina to dilate the cervix. Once inserted, the balloon is expanded with water, which causes the cervix to open.  Stripping the membranes. Your health care provider separates  amniotic sac tissue from the cervix, causing the cervix to be stretched and causing the release of a hormone called progesterone. This may cause the uterus to contract. It is often done during an office visit. You will be sent home to wait for the contractions to begin. You will then come in for an induction.  Breaking the water. Your health care provider makes a hole in the amniotic sac using a small instrument. Once the amniotic sac breaks, contractions should begin. This may still take hours to see an effect.  Medicine to trigger or strengthen contractions. This medicine is given through an IV access tube inserted into a vein in your arm. All of the methods of induction, besides stripping the membranes, will be done in the hospital. Induction is done in the hospital so that you and the baby can be carefully monitored. How long does it take for labor to be induced? Some inductions can take up to 2-3 days. Depending on the cervix, it usually takes less time. It takes longer when you are induced early in the pregnancy or if this is your first pregnancy. If a mother is still pregnant and the induction has been going on for 2-3 days, either the mother will be sent home or a cesarean delivery will be needed. What are the risks associated with labor induction? Some of the risks of induction include:  Changes in fetal heart rate, such as too high, too low, or erratic.  Fetal distress.    Chance of infection for the mother and baby.  Increased chance of having a cesarean delivery.  Breaking off (abruption) of the placenta from the uterus (rare).  Uterine rupture (very rare). When induction is needed for medical reasons, the benefits of induction may outweigh the risks. What are some reasons for not inducing labor? Labor induction should not be done if:  It is shown that your baby does not tolerate labor.  You have had previous surgeries on your uterus, such as a myomectomy or the removal of  fibroids.  Your placenta lies very low in the uterus and blocks the opening of the cervix (placenta previa).  Your baby is not in a head-down position.  The umbilical cord drops down into the birth canal in front of the baby. This could cut off the baby's blood and oxygen supply.  You have had a previous cesarean delivery.  There are unusual circumstances, such as the baby being extremely premature. This information is not intended to replace advice given to you by your health care provider. Make sure you discuss any questions you have with your health care provider. Document Released: 10/31/2006 Document Revised: 11/17/2015 Document Reviewed: 01/08/2013 Elsevier Interactive Patient Education  2017 Elsevier Inc.  

## 2017-11-12 NOTE — MAU Note (Signed)
I have communicated with Sam CNM and reviewed vital signs:  Vitals:   11/12/17 1433 11/12/17 1434  BP: (!) 93/51 (!) 106/59  Pulse: 80 85  Resp:    Temp:    SpO2:      Vaginal exam:  Dilation: 3.5 Effacement (%): 50 Station: Ballotable Presentation: Vertex Exam by:: Marvel Plan, Ginnie Smart RN,   Also reviewed contraction pattern and that non-stress test is reactive.  It has been documented that patient is contracting every 1-3 minutes with minimal cervical change over 24 hours not indicating active labor.  Patient denies any other complaints.  Based on this report provider has given order for discharge.  A discharge order and diagnosis entered by a provider.   Labor discharge instructions reviewed with patient.

## 2017-11-12 NOTE — MAU Provider Note (Signed)
History     CSN: [id number]  Arrival date and time: 11/12/17 1014   None     Chief Complaint  Patient presents with  . Contractions   HPI  Anita Love 22 y.o. G1P0 at [redacted]w[redacted]d who presents with painful abdominal contractions after being seen in office. Denies leaking of fluid, vaginal bleeding, reports normal fetal movement. Rates abdominal pain as 8-9/10 and states she is unable to ambulate for the recommended two hour interval. No aggravating or alleviating factors.  OB History    Gravida  1   Para      Term      Preterm      AB      Living        SAB      TAB      Ectopic      Multiple      Live Births              Past Medical History:  Diagnosis Date  . Headache    no meds   . Medical history non-contributory     Past Surgical History:  Procedure Laterality Date  . NO PAST SURGERIES      Family History  Problem Relation Age of Onset  . ADD / ADHD Neg Hx   . Anxiety disorder Neg Hx   . Cancer Neg Hx   . Hypertension Neg Hx   . Miscarriages / Stillbirths Neg Hx   . Vision loss Neg Hx   . Varicose Veins Neg Hx   . Stroke Neg Hx   . Obesity Neg Hx   . Learning disabilities Neg Hx   . Kidney disease Neg Hx   . Intellectual disability Neg Hx   . Hyperlipidemia Neg Hx   . Heart disease Neg Hx   . Early death Neg Hx   . Hearing loss Neg Hx   . Drug abuse Neg Hx   . Diabetes Neg Hx   . Depression Neg Hx   . COPD Neg Hx   . Birth defects Neg Hx   . Asthma Neg Hx   . Arthritis Neg Hx   . Alcohol abuse Neg Hx     Social History   Tobacco Use  . Smoking status: Former Games developer  . Smokeless tobacco: Never Used  Substance Use Topics  . Alcohol use: No  . Drug use: No    Allergies: No Known Allergies  Medications Prior to Admission  Medication Sig Dispense Refill Last Dose  . famotidine (PEPCID) 20 MG tablet Take 1 tablet (20 mg total) by mouth 2 (two) times daily. 30 tablet 0 Taking  . Prenatal Vit-Fe Fumarate-FA (PRENATAL  MULTIVITAMIN) TABS tablet Take 1 tablet by mouth daily at 12 noon.   Taking  - Nursing note and vitals reviewed - Alert and oriented x 3 - Normal respiratory effort - Regular heart rate - Gravid abdomen soft between contractions, nontender   - Reactive NST, baseline 135, moderate variability, positive accels, no decelerations noted. - Uterine contractions q 1-3 min, duration    MAU Course  Procedures  MDM - Patient instructed to ambulate for two hours prior to reassessment - Nubain  IM once - Phenergan 12.5mg  IM once  Assessment and Plan  A: - 22 y.o. @ G1P0  with false labor vs. Prodromal labor - Reactive NST, baseline 135, moderate variability, positive accels, no decelerations noted. - Uterine contractions q 1-3 min, duration  - Cervix unchanged from office visit three days  ago despite ambulation and frequent position changes. - Reassuring maternal/fetal status  P: - Discharge patient - Signs of active labor reviewed - Safe OTC analgesia medications and hydrotherapy for labor pain reviewed - Patient to return to MAU for worsening contractions, vaginal bleeding, decreased fetal movement, suspected ROM  Calvert Cantor, CNM 11/12/2017, 1:32 PM

## 2017-11-12 NOTE — MAU Note (Signed)
Pt reports uc's q 2-3 minutes

## 2017-11-12 NOTE — MAU Note (Signed)
Pt. States she has been having back pain all night, but obvious ctx started around 0800 this morning.  Pt. Was seen in the office today.  Pt. Rates pain 8/10.  Pt. Reports vaginal bleeding since 11pm last night.  The bleeding is light and pink.  Pt. Also reports clear d/c.

## 2017-11-12 NOTE — Progress Notes (Signed)
Pt states pain in back and belly w/ spotting, kept her up all night. Contractions are now every 3 min apart.

## 2017-11-12 NOTE — Progress Notes (Signed)
Subjective:  Anita Love is a 22 y.o. G1P0 at [redacted]w[redacted]d being seen today for ongoing prenatal care.  She is currently monitored for the following issues for this low-risk pregnancy and has Encounter for supervision of normal pregnancy and High risk teen pregnancy on their problem list.  Patient reports backache and contractions since last night.  Contractions: Irregular. Vag. Bleeding: Bloody Show.  Movement: Present. Denies leaking of fluid.   The following portions of the patient's history were reviewed and updated as appropriate: allergies, current medications, past family history, past medical history, past social history, past surgical history and problem list. Problem list updated.  Objective:   Vitals:   11/12/17 0946  BP: 111/71  Pulse: 91  Weight: 153 lb 4.8 oz (69.5 kg)    Fetal Status: Fetal Heart Rate (bpm): 148 Fundal Height: 39 cm Movement: Present  Presentation: Undeterminable  General:  Alert, oriented and cooperative. Patient is in no acute distress.  Skin: Skin is warm and dry. No rash noted.   Cardiovascular: Normal heart rate noted  Respiratory: Normal respiratory effort, no problems with respiration noted  Abdomen: Soft, gravid, appropriate for gestational age. Pain/Pressure: Present     Pelvic: Vag. Bleeding: Bloody Show     Cervical exam performed Dilation: 3 Effacement (%): 50 Station: Ballotable  Extremities: Normal range of motion.  Edema: Trace  Mental Status: Normal mood and affect. Normal behavior. Normal judgment and thought content.   Urinalysis:      Assessment and Plan:  Pregnancy: G1P0 at [redacted]w[redacted]d  1. Encounter for supervision of normal first pregnancy in third trimester Likely in early labor. No blood on exam today. Patient to MAU for labor evaluation. Has induction already scheduled.   Term labor symptoms and general obstetric precautions including but not limited to vaginal bleeding, contractions, leaking of fluid and fetal movement were reviewed in  detail with the patient. Please refer to After Visit Summary for other counseling recommendations.  Return in about 6 weeks (around 12/24/2017) for postpartum visit.   Pincus Large, DO

## 2017-11-12 NOTE — Discharge Instructions (Signed)
Vaginal delivery means that you will give birth by pushing your baby out of your birth canal (vagina). A team of health care providers will help you before, during, and after vaginal delivery. Birth experiences are unique for every woman and every pregnancy, and birth experiences vary depending on where you choose to give birth. What should I do to prepare for my baby's birth? Before your baby is born, it is important to talk with your health care provider about:  Your labor and delivery preferences. These may include: ? Medicines that you may be given. ? How you will manage your pain. This might include non-medical pain relief techniques or injectable pain relief such as epidural analgesia. ? How you and your baby will be monitored during labor and delivery. ? Who may be in the labor and delivery room with you. ? Your feelings about surgical delivery of your baby (cesarean delivery, or C-section) if this becomes necessary. ? Your feelings about receiving donated blood through an IV tube (blood transfusion) if this becomes necessary.  Whether you are able: ? To take pictures or videos of the birth. ? To eat during labor and delivery. ? To move around, walk, or change positions during labor and delivery.  What to expect after your baby is born, such as: ? Whether delayed umbilical cord clamping and cutting is offered. ? Who will care for your baby right after birth. ? Medicines or tests that may be recommended for your baby. ? Whether breastfeeding is supported in your hospital or birth center. ? How long you will be in the hospital or birth center.  How any medical conditions you have may affect your baby or your labor and delivery experience.  To prepare for your baby's birth, you should also:  Attend all of your health care visits before delivery (prenatal visits) as recommended by your health care provider. This is important.  Prepare your home for your baby's arrival. Make sure  that you have: ? Diapers. ? Baby clothing. ? Feeding equipment. ? Safe sleeping arrangements for you and your baby.  Install a car seat in your vehicle. Have your car seat checked by a certified car seat installer to make sure that it is installed safely.  Think about who will help you with your new baby at home for at least the first several weeks after delivery.  What can I expect when I arrive at the birth center or hospital? Once you are in labor and have been admitted into the hospital or birth center, your health care provider may:  Review your pregnancy history and any concerns you have.  Insert an IV tube into one of your veins. This is used to give you fluids and medicines.  Check your blood pressure, pulse, temperature, and heart rate (vital signs).  Check whether your bag of water (amniotic sac) has broken (ruptured).  Talk with you about your birth plan and discuss pain control options.  Monitoring Your health care provider may monitor your contractions (uterine monitoring) and your baby's heart rate (fetal monitoring). You may need to be monitored:  Often, but not continuously (intermittently).  All the time or for long periods at a time (continuously). Continuous monitoring may be needed if: ? You are taking certain medicines, such as medicine to relieve pain or make your contractions stronger. ? You have pregnancy or labor complications.  Monitoring may be done by:  Placing a special stethoscope or a handheld monitoring device on your abdomen to check your   baby's heartbeat, and feeling your abdomen for contractions. This method of monitoring does not continuously record your baby's heartbeat or your contractions.  Placing monitors on your abdomen (external monitors) to record your baby's heartbeat and the frequency and length of contractions. You may not have to wear external monitors all the time.  Placing monitors inside of your uterus (internal monitors) to  record your baby's heartbeat and the frequency, length, and strength of your contractions. ? Your health care provider may use internal monitors if he or she needs more information about the strength of your contractions or your baby's heart rate. ? Internal monitors are put in place by passing a thin, flexible wire through your vagina and into your uterus. Depending on the type of monitor, it may remain in your uterus or on your baby's head until birth. ? Your health care provider will discuss the benefits and risks of internal monitoring with you and will ask for your permission before inserting the monitors.  Telemetry. This is a type of continuous monitoring that can be done with external or internal monitors. Instead of having to stay in bed, you are able to move around during telemetry. Ask your health care provider if telemetry is an option for you.  Physical exam Your health care provider may perform a physical exam. This may include:  Checking whether your baby is positioned: ? With the head toward your vagina (head-down). This is most common. ? With the head toward the top of your uterus (head-up or breech). If your baby is in a breech position, your health care provider may try to turn your baby to a head-down position so you can deliver vaginally. If it does not seem that your baby can be born vaginally, your provider may recommend surgery to deliver your baby. In rare cases, you may be able to deliver vaginally if your baby is head-up (breech delivery). ? Lying sideways (transverse). Babies that are lying sideways cannot be delivered vaginally.  Checking your cervix to determine: ? Whether it is thinning out (effacing). ? Whether it is opening up (dilating). ? How low your baby has moved into your birth canal.  What are the three stages of labor and delivery?  Normal labor and delivery is divided into the following three stages: Stage 1  Stage 1 is the longest stage of labor,  and it can last for hours or days. Stage 1 includes: ? Early labor. This is when contractions may be irregular, or regular and mild. Generally, early labor contractions are more than 10 minutes apart. ? Active labor. This is when contractions get longer, more regular, more frequent, and more intense. ? The transition phase. This is when contractions happen very close together, are very intense, and may last longer than during any other part of labor.  Contractions generally feel mild, infrequent, and irregular at first. They get stronger, more frequent (about every 2-3 minutes), and more regular as you progress from early labor through active labor and transition.  Many women progress through stage 1 naturally, but you may need help to continue making progress. If this happens, your health care provider may talk with you about: ? Rupturing your amniotic sac if it has not ruptured yet. ? Giving you medicine to help make your contractions stronger and more frequent.  Stage 1 ends when your cervix is completely dilated to 4 inches (10 cm) and completely effaced. This happens at the end of the transition phase. Stage 2  Once your cervix   is completely effaced and dilated to 4 inches (10 cm), you may start to feel an urge to push. It is common for the body to naturally take a rest before feeling the urge to push, especially if you received an epidural or certain other pain medicines. This rest period may last for up to 1-2 hours, depending on your unique labor experience.  During stage 2, contractions are generally less painful, because pushing helps relieve contraction pain. Instead of contraction pain, you may feel stretching and burning pain, especially when the widest part of your baby's head passes through the vaginal opening (crowning).  Your health care provider will closely monitor your pushing progress and your baby's progress through the vagina during stage 2.  Your health care provider may  massage the area of skin between your vaginal opening and anus (perineum) or apply warm compresses to your perineum. This helps it stretch as the baby's head starts to crown, which can help prevent perineal tearing. ? In some cases, an incision may be made in your perineum (episiotomy) to allow the baby to pass through the vaginal opening. An episiotomy helps to make the opening of the vagina larger to allow more room for the baby to fit through.  It is very important to breathe and focus so your health care provider can control the delivery of your baby's head. Your health care provider may have you decrease the intensity of your pushing, to help prevent perineal tearing.  After delivery of your baby's head, the shoulders and the rest of the body generally deliver very quickly and without difficulty.  Once your baby is delivered, the umbilical cord may be cut right away, or this may be delayed for 1-2 minutes, depending on your baby's health. This may vary among health care providers, hospitals, and birth centers.  If you and your baby are healthy enough, your baby may be placed on your chest or abdomen to help maintain the baby's temperature and to help you bond with each other. Some mothers and babies start breastfeeding at this time. Your health care team will dry your baby and help keep your baby warm during this time.  Your baby may need immediate care if he or she: ? Showed signs of distress during labor. ? Has a medical condition. ? Was born too early (prematurely). ? Had a bowel movement before birth (meconium). ? Shows signs of difficulty transitioning from being inside the uterus to being outside of the uterus. If you are planning to breastfeed, your health care team will help you begin a feeding. Stage 3  The third stage of labor starts immediately after the birth of your baby and ends after you deliver the placenta. The placenta is an organ that develops during pregnancy to provide  oxygen and nutrients to your baby in the womb.  Delivering the placenta may require some pushing, and you may have mild contractions. Breastfeeding can stimulate contractions to help you deliver the placenta.  After the placenta is delivered, your uterus should tighten (contract) and become firm. This helps to stop bleeding in your uterus. To help your uterus contract and to control bleeding, your health care provider may: ? Give you medicine by injection, through an IV tube, by mouth, or through your rectum (rectally). ? Massage your abdomen or perform a vaginal exam to remove any blood clots that are left in your uterus. ? Empty your bladder by placing a thin, flexible tube (catheter) into your bladder. ? Encourage you to   breastfeed your baby. After labor is over, you and your baby will be monitored closely to ensure that you are both healthy until you are ready to go home. Your health care team will teach you how to care for yourself and your baby. This information is not intended to replace advice given to you by your health care provider. Make sure you discuss any questions you have with your health care provider. Document Released: 03/20/2008 Document Revised: 12/30/2015 Document Reviewed: 06/26/2015 Elsevier Interactive Patient Education  2018 [employer]  

## 2017-11-13 ENCOUNTER — Inpatient Hospital Stay (HOSPITAL_COMMUNITY): Payer: Medicaid Other | Admitting: Anesthesiology

## 2017-11-13 ENCOUNTER — Inpatient Hospital Stay (HOSPITAL_COMMUNITY)
Admission: AD | Admit: 2017-11-13 | Discharge: 2017-11-16 | DRG: 786 | Disposition: A | Discharge status: Home / Self Care | Payer: Medicaid Other | Source: Ambulatory Visit | Attending: Obstetrics & Gynecology | Admitting: Obstetrics & Gynecology

## 2017-11-13 ENCOUNTER — Other Ambulatory Visit: Payer: Self-pay

## 2017-11-13 ENCOUNTER — Encounter (HOSPITAL_COMMUNITY): Payer: Self-pay

## 2017-11-13 DIAGNOSIS — O411230 Chorioamnionitis, third trimester, not applicable or unspecified: Secondary | ICD-10-CM | POA: Diagnosis present

## 2017-11-13 DIAGNOSIS — Z87891 Personal history of nicotine dependence: Secondary | ICD-10-CM

## 2017-11-13 DIAGNOSIS — O411030 Infection of amniotic sac and membranes, unspecified, third trimester, not applicable or unspecified: Secondary | ICD-10-CM | POA: Diagnosis not present

## 2017-11-13 DIAGNOSIS — O98919 Unspecified maternal infectious and parasitic disease complicating pregnancy, unspecified trimester: Secondary | ICD-10-CM | POA: Diagnosis present

## 2017-11-13 DIAGNOSIS — Z3A39 39 weeks gestation of pregnancy: Secondary | ICD-10-CM

## 2017-11-13 DIAGNOSIS — Z98891 History of uterine scar from previous surgery: Secondary | ICD-10-CM

## 2017-11-13 DIAGNOSIS — Z3A40 40 weeks gestation of pregnancy: Secondary | ICD-10-CM | POA: Diagnosis not present

## 2017-11-13 LAB — CBC
HEMATOCRIT: 45.6 % (ref 36.0–46.0)
Hemoglobin: 15.1 g/dL — ABNORMAL HIGH (ref 12.0–15.0)
MCH: 31.1 pg (ref 26.0–34.0)
MCHC: 33.1 g/dL (ref 30.0–36.0)
MCV: 94 fL (ref 78.0–100.0)
PLATELETS: 148 10*3/uL — AB (ref 150–400)
RBC: 4.85 MIL/uL (ref 3.87–5.11)
RDW: 14.3 % (ref 11.5–15.5)
WBC: 12 10*3/uL — AB (ref 4.0–10.5)

## 2017-11-13 LAB — ABO/RH: ABO/RH(D): O POS

## 2017-11-13 LAB — TYPE AND SCREEN
ABO/RH(D): O POS
Antibody Screen: NEGATIVE

## 2017-11-13 MED ORDER — ONDANSETRON HCL 4 MG/2ML IJ SOLN
4.0000 mg | Freq: Four times a day (QID) | INTRAMUSCULAR | Status: DC | PRN
  Administered 2017-11-13: 4 mg via INTRAVENOUS
  Filled 2017-11-13: qty 2

## 2017-11-13 MED ORDER — LIDOCAINE-EPINEPHRINE (PF) 2 %-1:200000 IJ SOLN
INTRAMUSCULAR | Status: DC | PRN
  Administered 2017-11-13: 5 mL via EPIDURAL
  Administered 2017-11-13: 3 mL via EPIDURAL
  Administered 2017-11-13: 2 mL via EPIDURAL

## 2017-11-13 MED ORDER — LACTATED RINGERS IV SOLN
INTRAVENOUS | Status: DC
  Administered 2017-11-13 – 2017-11-14 (×3): via INTRAVENOUS

## 2017-11-13 MED ORDER — SOD CITRATE-CITRIC ACID 500-334 MG/5ML PO SOLN
30.0000 mL | ORAL | Status: DC | PRN
  Administered 2017-11-14: 30 mL via ORAL
  Filled 2017-11-13: qty 15

## 2017-11-13 MED ORDER — LIDOCAINE HCL (PF) 1 % IJ SOLN: 30.0000 mL | INTRAMUSCULAR | Status: DC | PRN

## 2017-11-13 MED ORDER — EPHEDRINE 5 MG/ML INJ: 10.0000 mg | INTRAVENOUS | Status: DC | PRN

## 2017-11-13 MED ORDER — OXYTOCIN 40 UNITS IN LACTATED RINGERS INFUSION - SIMPLE MED
2.5000 [IU]/h | INTRAVENOUS | Status: DC
  Filled 2017-11-13: qty 1000

## 2017-11-13 MED ORDER — BUTORPHANOL TARTRATE 1 MG/ML IJ SOLN
1.0000 mg | Freq: Once | INTRAMUSCULAR | Status: AC
  Administered 2017-11-13: 1 mg via INTRAMUSCULAR
  Filled 2017-11-13: qty 1

## 2017-11-13 MED ORDER — LACTATED RINGERS IV SOLN: 500.0000 mL | Freq: Once | INTRAVENOUS | Status: DC

## 2017-11-13 MED ORDER — FENTANYL CITRATE (PF) 100 MCG/2ML IJ SOLN
100.0000 ug | INTRAMUSCULAR | Status: DC | PRN
  Administered 2017-11-13 (×2): 100 ug via INTRAVENOUS
  Filled 2017-11-13 (×2): qty 2

## 2017-11-13 MED ORDER — ACETAMINOPHEN 325 MG PO TABS: 650.0000 mg | ORAL_TABLET | ORAL | Status: DC | PRN

## 2017-11-13 MED ORDER — OXYTOCIN 40 UNITS IN LACTATED RINGERS INFUSION - SIMPLE MED
1.0000 m[IU]/min | INTRAVENOUS | Status: DC
  Administered 2017-11-13: 2 m[IU]/min via INTRAVENOUS
  Administered 2017-11-14: 26 m[IU]/min via INTRAVENOUS
  Administered 2017-11-14: 8 m[IU]/min via INTRAVENOUS
  Filled 2017-11-13: qty 1000

## 2017-11-13 MED ORDER — PHENYLEPHRINE 40 MCG/ML (10ML) SYRINGE FOR IV PUSH (FOR BLOOD PRESSURE SUPPORT): 80.0000 ug | PREFILLED_SYRINGE | INTRAVENOUS | Status: DC | PRN

## 2017-11-13 MED ORDER — OXYCODONE-ACETAMINOPHEN 5-325 MG PO TABS: 1.0000 | ORAL_TABLET | ORAL | Status: DC | PRN

## 2017-11-13 MED ORDER — PHENYLEPHRINE 40 MCG/ML (10ML) SYRINGE FOR IV PUSH (FOR BLOOD PRESSURE SUPPORT)
80.0000 ug | PREFILLED_SYRINGE | INTRAVENOUS | Status: DC | PRN
  Filled 2017-11-13: qty 10

## 2017-11-13 MED ORDER — FENTANYL 2.5 MCG/ML BUPIVACAINE 1/10 % EPIDURAL INFUSION (WH - ANES)
14.0000 mL/h | INTRAMUSCULAR | Status: DC | PRN
  Administered 2017-11-13 – 2017-11-14 (×4): 14 mL/h via EPIDURAL
  Filled 2017-11-13 (×4): qty 100

## 2017-11-13 MED ORDER — DIPHENHYDRAMINE HCL 50 MG/ML IJ SOLN: 12.5000 mg | INTRAMUSCULAR | Status: DC | PRN

## 2017-11-13 MED ORDER — OXYTOCIN BOLUS FROM INFUSION: 500.0000 mL | Freq: Once | INTRAVENOUS | Status: DC

## 2017-11-13 MED ORDER — LACTATED RINGERS IV SOLN: 500.0000 mL | INTRAVENOUS | Status: DC | PRN

## 2017-11-13 MED ORDER — OXYCODONE-ACETAMINOPHEN 5-325 MG PO TABS: 2.0000 | ORAL_TABLET | ORAL | Status: DC | PRN

## 2017-11-13 MED ORDER — EPHEDRINE 5 MG/ML INJ
10.0000 mg | INTRAVENOUS | Status: DC | PRN
Start: 2017-11-13 — End: 2017-11-13

## 2017-11-13 MED ORDER — TERBUTALINE SULFATE 1 MG/ML IJ SOLN: 0.2500 mg | Freq: Once | INTRAMUSCULAR | Status: DC | PRN

## 2017-11-13 MED ORDER — PHENYLEPHRINE 40 MCG/ML (10ML) SYRINGE FOR IV PUSH (FOR BLOOD PRESSURE SUPPORT)
80.0000 ug | PREFILLED_SYRINGE | INTRAVENOUS | Status: DC | PRN
  Administered 2017-11-13 (×2): 80 ug via INTRAVENOUS

## 2017-11-13 NOTE — Anesthesia Preprocedure Evaluation (Addendum)
Anesthesia Evaluation  Patient identified by MRN, date of birth, ID band Patient awake    Reviewed: Allergy & Precautions, NPO status , Patient's Chart, lab work & pertinent test results  Airway Mallampati: II  TM Distance: >3 FB     Dental   Pulmonary former smoker,    breath sounds clear to auscultation       Cardiovascular negative cardio ROS   Rhythm:Regular Rate:Normal     Neuro/Psych negative neurological ROS     GI/Hepatic negative GI ROS, Neg liver ROS,   Endo/Other  negative endocrine ROS  Renal/GU negative Renal ROS     Musculoskeletal   Abdominal   Peds  Hematology negative hematology ROS (+)   Anesthesia Other Findings   Reproductive/Obstetrics (+) Pregnancy                             Lab Results  Component Value Date   WBC 12.0 (H) 11/13/2017   HGB 15.1 (H) 11/13/2017   HCT 45.6 11/13/2017   MCV 94.0 11/13/2017   PLT 148 (L) 11/13/2017    Anesthesia Physical Anesthesia Plan  ASA: II and emergent  Anesthesia Plan: Epidural   Post-op Pain Management:    Induction:   PONV Risk Score and Plan: Treatment may vary due to age or medical condition  Airway Management Planned: Natural Airway  Additional Equipment:   Intra-op Plan:   Post-operative Plan:   Informed Consent: I have reviewed the patients History and Physical, chart, labs and discussed the procedure including the risks, benefits and alternatives for the proposed anesthesia with the patient or authorized representative who has indicated his/her understanding and acceptance.     Plan Discussed with:   Anesthesia Plan Comments: (Emergent C-section for failure to progress.)       Anesthesia Quick Evaluation

## 2017-11-13 NOTE — Progress Notes (Signed)
LABOR PROGRESS NOTE  Anita Love is a 22 y.o. G1P0 at [redacted]w[redacted]d  admitted for IOL for latent labor and nonreactive NST  Subjective: Patient comfortable with epidural  Objective: BP 117/63   Pulse (!) 105   Temp 99.3 F (37.4 C) (Oral)   Resp 18   Wt 154 lb 8 oz (70.1 kg)   LMP 02/07/2017 (Exact Date)   SpO2 97%   BMI 29.19 kg/m  or  Vitals:   11/13/17 1901 11/13/17 1932 11/13/17 2001 11/13/17 2031  BP: 112/69 108/79 100/68 117/63  Pulse: 94 96 94 (!) 105  Resp:  Temp:  99.3 F (37.4 C)    TempSrc:  Oral    SpO2:      Weight:        Last SVE at 2034: Dilation: 5.5 Effacement (%): 90 Cervical Position: Middle Station: -2 Presentation: Vertex Exam by:: [employer] RN   FHT: baseline rate 150, moderate varibility, +acel, occasional variable decel Toco: ctx q 1-4 min  Assessment / Plan:  Labor: Continue to titrate Pitocin Fetal Wellbeing:  Cat II Pain Control:  Well-controlled with epiudral Anticipated MOD:  SVD  Frederik Pear, MD 11/13/2017, 9:13 PM

## 2017-11-13 NOTE — Progress Notes (Signed)
   11/13/17 0706 11/13/17 0711  Fetal Heart Rate A  Mode External  --   Baseline Rate (A) 130 bpm  --   Variability 6-25 BPM  --   Accelerations 15 x 15  --   Decelerations None  --   Uterine Activity  Mode Toco  --   Contraction Frequency (min) 5-7  --   Contraction Duration (sec) 90-110  --   Contraction Quality Moderate  --   Resting Tone Palpated Relaxed  --   Resting Time Adequate  --   Cervical Exam  Dilation  --  4  Effacement (%)  --  80  Cervical Position  --  Middle  Cervical Consistency  --  Medium  Vag. Bleeding  --  None  Clots  --  None  Station  --  Ballotable  Presentation  --  Vertex  Exam by:  --  Claudette Laws rnc  Genella Rife CNM called & report given on above assessment & pt history.  Orders rec'd to recheck x1hr.

## 2017-11-13 NOTE — Progress Notes (Signed)
Patient ID: Anita Love, female   DOB: 10-30-1995, 22 y.o.   MRN: [id number]  Comfortable w/ epidural; started on Pit since she didn't have cx change  BP 108/79, other VSS FHR 150s, decreased LTV, +10x10accels, occ variables Ctx q 2-3 mins w/ Pit @ 84mu/min Cx 5/90/-1/-2 per RN exam at start of Pit  IUP@term  Prolonged latent phase  Continue to increase Pit to achieve adequate labor Anticipate SVD  Cam Hai CNM 11/13/2017

## 2017-11-13 NOTE — Discharge Instructions (Signed)
Braxton Hicks Contractions °Contractions of the uterus can occur throughout pregnancy, but they are not always a sign that you are in labor. You may have practice contractions called Braxton Hicks contractions. These false labor contractions are sometimes confused with true labor. °What are Braxton Hicks contractions? °Braxton Hicks contractions are tightening movements that occur in the muscles of the uterus before labor. Unlike true labor contractions, these contractions do not result in opening (dilation) and thinning of the cervix. Toward the end of pregnancy (32-34 weeks), Braxton Hicks contractions can happen more often and may become stronger. These contractions are sometimes difficult to tell apart from true labor because they can be very uncomfortable. You should not feel embarrassed if you go to the hospital with false labor. °Sometimes, the only way to tell if you are in true labor is for your health care provider to look for changes in the cervix. The health care provider will do a physical exam and may monitor your contractions. If you are not in true labor, the exam should show that your cervix is not dilating and your water has not broken. °If there are other health problems associated with your pregnancy, it is completely safe for you to be sent home with false labor. You may continue to have Braxton Hicks contractions until you go into true labor. °How to tell the difference between true labor and false labor °True labor °· Contractions last 30-70 seconds. °· Contractions become very regular. °· Discomfort is usually felt in the top of the uterus, and it spreads to the lower abdomen and low back. °· Contractions do not go away with walking. °· Contractions usually become more intense and increase in frequency. °· The cervix dilates and gets thinner. °False labor °· Contractions are usually shorter and not as strong as true labor contractions. °· Contractions are usually irregular. °· Contractions  are often felt in the front of the lower abdomen and in the groin. °· Contractions may go away when you walk around or change positions while lying down. °· Contractions get weaker and are shorter-lasting as time goes on. °· The cervix usually does not dilate or become thin. °Follow these instructions at home: °· Take over-the-counter and prescription medicines only as told by your health care provider. °· Keep up with your usual exercises and follow other instructions from your health care provider. °· Eat and drink lightly if you think you are going into labor. °· If Braxton Hicks contractions are making you uncomfortable: °? Change your position from lying down or resting to walking, or change from walking to resting. °? Sit and rest in a tub of warm water. °? Drink enough fluid to keep your urine pale yellow. Dehydration may cause these contractions. °? Do slow and deep breathing several times an hour. °· Keep all follow-up prenatal visits as told by your health care provider. This is important. °Contact a health care provider if: °· You have a fever. °· You have continuous pain in your abdomen. °Get help right away if: °· Your contractions become stronger, more regular, and closer together. °· You have fluid leaking or gushing from your vagina. °· You pass blood-tinged mucus (bloody show). °· You have bleeding from your vagina. °· You have low back pain that you never had before. °· You feel your baby’s head pushing down and causing pelvic pressure. °· Your baby is not moving inside you as much as it used to. °Summary °· Contractions that occur before labor are called Braxton   Hicks contractions, false labor, or practice contractions. °· Braxton Hicks contractions are usually shorter, weaker, farther apart, and less regular than true labor contractions. True labor contractions usually become progressively stronger and regular and they become more frequent. °· Manage discomfort from Braxton Hicks contractions by  changing position, resting in a warm bath, drinking plenty of water, or practicing deep breathing. °This information is not intended to replace advice given to you by your health care provider. Make sure you discuss any questions you have with your health care provider. °Document Released: 10/25/2016 Document Revised: 10/25/2016 Document Reviewed: 10/25/2016 °Elsevier Interactive Patient Education © 2018 [employer] ° °

## 2017-11-13 NOTE — MAU Note (Signed)
I have communicated with Dr. Emelia Loron  and reviewed vital signs:  Vitals:   11/13/17 0021 11/13/17 0225  BP: 109/72 120/71  Pulse: 93 94  Resp: 18 16  Temp: 97.9 F (36.6 C)     Vaginal exam:  Dilation: 3 Effacement (%): 80 Cervical Position: Middle Station: Ballotable Exam by:: B Janyth Riera RN,   Also reviewed contraction pattern and that non-stress test is reactive.  It has been documented that patient is contracting every 1-5 minutes with no cervical change since she was discharged yesterday. Pt refused additional cervical exam prior to discharge. Patient denies any other complaints.  Based on this report provider has given order for discharge.  A discharge order and diagnosis entered by a provider.   Labor discharge instructions reviewed with patient.

## 2017-11-13 NOTE — Anesthesia Procedure Notes (Signed)
Epidural Patient location during procedure: OB Start time: 11/13/2017 1:37 PM End time: 11/13/2017 1:43 PM  Staffing Anesthesiologist: Marcene Duos, MD Performed: anesthesiologist   Preanesthetic Checklist Completed: patient identified, site marked, surgical consent, pre-op evaluation, timeout performed, IV checked, risks and benefits discussed and monitors and equipment checked  Epidural Patient position: sitting Prep: site prepped and draped and DuraPrep Patient monitoring: continuous pulse ox and blood pressure Approach: midline Location: L3-L4 Injection technique: LOR air  Needle:  Needle type: Tuohy  Needle gauge: 17 G Needle length: 9 cm and 9 Needle insertion depth: 6 cm Catheter type: closed end flexible Catheter size: 19 Gauge Catheter at skin depth: 11 cm Test dose: negative  Assessment Events: blood not aspirated, injection not painful, no injection resistance, negative IV test and no paresthesia

## 2017-11-13 NOTE — H&P (Signed)
Anita Love is a 22 y.o. female G1 @ 39.6wks by LMP presenting for eval for labor. Reports a couple of days of prodromal ctx without cx change past 4cm. Denies leaking or bldg. Reports +FM. No H/A, N/V or visual disturbances. Her preg has been followed by the [facility] service and has been essentially unremarkable since onset of care at 22wks.  OB History    Gravida  1   Para      Term      Preterm      AB      Living        SAB      TAB      Ectopic      Multiple      Live Births             Past Medical History:  Diagnosis Date  . Headache    no meds   . Medical history non-contributory    Past Surgical History:  Procedure Laterality Date  . NO PAST SURGERIES     Family History: family history is not on file. Social History:  reports that she has quit smoking. She has never used smokeless tobacco. She reports that she does not drink alcohol or use drugs.     Maternal Diabetes: No Genetic Screening: Declined Maternal Ultrasounds/Referrals: Normal, other than incomplete heart anatomy seen Fetal Ultrasounds or other Referrals:  None Maternal Substance Abuse:  No Significant Maternal Medications:  None Significant Maternal Lab Results:  Lab values include: Group B Strep negative Other Comments:  None  ROS History Dilation: 4 Effacement (%): 80 Station: Ballotable Exam by:: jaton burgess rnc Blood pressure (!) 105/57, pulse 92, temperature 98.4 F (36.9 C), temperature source Oral, resp. rate 19, last menstrual period 02/07/2017, SpO2 99 %. Exam Physical Exam  Constitutional: She is oriented to person, place, and time. She appears well-developed.  HENT:  Head: Normocephalic.  Neck: Normal range of motion.  Cardiovascular: Normal rate.  Respiratory: Effort normal.  GI:  EFM 130-140s, +10x10accels, occ variables Ctx q 4-6 mins  Musculoskeletal: Normal range of motion.  Neurological: She is alert and oriented to person, place, and time.  Skin: Skin  is warm and dry.  Psychiatric: She has a normal mood and affect. Her behavior is normal. Thought content normal.    Prenatal labs: ABO, Rh: O/Positive/-- (01/18 1623) Antibody: Negative (01/18 1623) Rubella: 18.70 (01/18 1623) RPR: Non Reactive (03/06 1300)  HBsAg: Negative (01/18 1623)  HIV: Non Reactive (03/06 1300)  GBS: Negative (04/18 0000)   Assessment/Plan: IUP@term  Latent/prodromal labor Non-reactive NST (10x10accels, small variables) GBS neg  Admit to [facility] Expectant management May need to augment with Pit/AROM; will obs first and see if she makes change Anticipate SVD   Cam Hai CNM 11/13/2017, 9:36 AM

## 2017-11-13 NOTE — Progress Notes (Signed)
Patient ID: [place] Benko, female   DOB: 05/27/1996, 22 y.o.   MRN: [id number]  Comfortable w/ epidural  BP 109/74, P 105 FHR 130s, +accels, no decels Ctx irreg 3-6 mins Cx 5/90/-2; AROM for clear fluid  IUP@term  Latent phase GBS neg  Plan to check cx in 2 hrs; if no change, start Pit Anticipate SVD  Cam Hai CNM 11/13/2017 3:46 PM

## 2017-11-13 NOTE — MAU Note (Signed)
Pt states she has been contracting past 2days & it has gotten to a point where she cannot sleep.  SVE last night in MAU 3cm.  States she has noted vag spotting x2days, but not saturating a pad.  Denies LOF.

## 2017-11-14 ENCOUNTER — Encounter (HOSPITAL_COMMUNITY): Payer: Self-pay | Admitting: *Deleted

## 2017-11-14 ENCOUNTER — Encounter (HOSPITAL_COMMUNITY)
Admission: AD | Disposition: A | Discharge status: Home / Self Care | Payer: Self-pay | Source: Ambulatory Visit | Attending: Obstetrics & Gynecology

## 2017-11-14 ENCOUNTER — Encounter: Payer: Medicaid Other | Admitting: Student

## 2017-11-14 DIAGNOSIS — O98919 Unspecified maternal infectious and parasitic disease complicating pregnancy, unspecified trimester: Secondary | ICD-10-CM | POA: Diagnosis present

## 2017-11-14 DIAGNOSIS — Z3A40 40 weeks gestation of pregnancy: Secondary | ICD-10-CM

## 2017-11-14 DIAGNOSIS — O411030 Infection of amniotic sac and membranes, unspecified, third trimester, not applicable or unspecified: Secondary | ICD-10-CM

## 2017-11-14 LAB — RPR: RPR Ser Ql: NONREACTIVE

## 2017-11-14 SURGERY — Surgical Case
Anesthesia: Epidural | Site: Abdomen

## 2017-11-14 MED ORDER — OXYCODONE-ACETAMINOPHEN 5-325 MG PO TABS: 1.0000 | ORAL_TABLET | ORAL | Status: DC | PRN

## 2017-11-14 MED ORDER — SODIUM CHLORIDE 0.9 % IV SOLN
500.0000 mg | INTRAVENOUS | Status: AC
  Administered 2017-11-14: 500 mg via INTRAVENOUS
  Filled 2017-11-14: qty 500

## 2017-11-14 MED ORDER — MENTHOL 3 MG MT LOZG: 1.0000 | LOZENGE | OROMUCOSAL | Status: DC | PRN

## 2017-11-14 MED ORDER — DIBUCAINE 1 % RE OINT: 1.0000 "application " | TOPICAL_OINTMENT | RECTAL | Status: DC | PRN

## 2017-11-14 MED ORDER — IBUPROFEN 600 MG PO TABS
600.0000 mg | ORAL_TABLET | Freq: Four times a day (QID) | ORAL | Status: DC
  Administered 2017-11-15 – 2017-11-16 (×6): 600 mg via ORAL
  Filled 2017-11-14 (×7): qty 1

## 2017-11-14 MED ORDER — FERROUS SULFATE 325 (65 FE) MG PO TABS
325.0000 mg | ORAL_TABLET | Freq: Two times a day (BID) | ORAL | Status: DC
  Administered 2017-11-15 – 2017-11-16 (×3): 325 mg via ORAL
  Filled 2017-11-14 (×3): qty 1

## 2017-11-14 MED ORDER — SCOPOLAMINE 1 MG/3DAYS TD PT72: 1.0000 | MEDICATED_PATCH | Freq: Once | TRANSDERMAL | Status: DC

## 2017-11-14 MED ORDER — NALBUPHINE HCL 10 MG/ML IJ SOLN: 5.0000 mg | Freq: Once | INTRAMUSCULAR | Status: DC | PRN

## 2017-11-14 MED ORDER — WITCH HAZEL-GLYCERIN EX PADS: 1.0000 "application " | MEDICATED_PAD | CUTANEOUS | Status: DC | PRN

## 2017-11-14 MED ORDER — DIPHENHYDRAMINE HCL 25 MG PO CAPS: 25.0000 mg | ORAL_CAPSULE | Freq: Four times a day (QID) | ORAL | Status: DC | PRN

## 2017-11-14 MED ORDER — ONDANSETRON HCL 4 MG/2ML IJ SOLN: 4.0000 mg | Freq: Three times a day (TID) | INTRAMUSCULAR | Status: DC | PRN

## 2017-11-14 MED ORDER — KETOROLAC TROMETHAMINE 30 MG/ML IJ SOLN
30.0000 mg | Freq: Four times a day (QID) | INTRAMUSCULAR | Status: AC | PRN
  Administered 2017-11-14: 30 mg via INTRAMUSCULAR

## 2017-11-14 MED ORDER — PHENYLEPHRINE HCL 10 MG/ML IJ SOLN
INTRAMUSCULAR | Status: DC | PRN
  Administered 2017-11-14: 120 ug via INTRAVENOUS
  Administered 2017-11-14 (×2): 80 ug via INTRAVENOUS
  Administered 2017-11-14 (×3): 120 ug via INTRAVENOUS

## 2017-11-14 MED ORDER — PIPERACILLIN-TAZOBACTAM 3.375 G IVPB
3.3750 g | Freq: Three times a day (TID) | INTRAVENOUS | Status: AC
  Administered 2017-11-14 – 2017-11-15 (×3): 3.375 g via INTRAVENOUS
  Filled 2017-11-14 (×3): qty 50

## 2017-11-14 MED ORDER — MEPERIDINE HCL 25 MG/ML IJ SOLN: 6.2500 mg | INTRAMUSCULAR | Status: DC | PRN

## 2017-11-14 MED ORDER — METHYLERGONOVINE MALEATE 0.2 MG PO TABS
0.2000 mg | ORAL_TABLET | Freq: Three times a day (TID) | ORAL | Status: AC
  Administered 2017-11-14 – 2017-11-15 (×3): 0.2 mg via ORAL
  Filled 2017-11-14 (×3): qty 1

## 2017-11-14 MED ORDER — SENNOSIDES-DOCUSATE SODIUM 8.6-50 MG PO TABS
2.0000 | ORAL_TABLET | ORAL | Status: DC
  Administered 2017-11-15 (×2): 2 via ORAL
  Filled 2017-11-14 (×2): qty 2

## 2017-11-14 MED ORDER — MEPERIDINE HCL 25 MG/ML IJ SOLN
INTRAMUSCULAR | Status: DC | PRN
  Administered 2017-11-14: 25 mg via INTRAVENOUS

## 2017-11-14 MED ORDER — GENTAMICIN SULFATE 40 MG/ML IJ SOLN
INTRAVENOUS | Status: AC
  Administered 2017-11-14: 115 mL via INTRAVENOUS
  Filled 2017-11-14: qty 8.75

## 2017-11-14 MED ORDER — DIPHENHYDRAMINE HCL 50 MG/ML IJ SOLN: 12.5000 mg | INTRAMUSCULAR | Status: DC | PRN

## 2017-11-14 MED ORDER — MEPERIDINE HCL 25 MG/ML IJ SOLN
INTRAMUSCULAR | Status: AC
  Filled 2017-11-14: qty 1

## 2017-11-14 MED ORDER — LACTATED RINGERS IV SOLN: INTRAVENOUS | Status: DC

## 2017-11-14 MED ORDER — DIPHENHYDRAMINE HCL 25 MG PO CAPS: 25.0000 mg | ORAL_CAPSULE | ORAL | Status: DC | PRN

## 2017-11-14 MED ORDER — ACETAMINOPHEN 325 MG PO TABS: 650.0000 mg | ORAL_TABLET | ORAL | Status: DC | PRN

## 2017-11-14 MED ORDER — ACETAMINOPHEN 500 MG PO TABS
1000.0000 mg | ORAL_TABLET | Freq: Four times a day (QID) | ORAL | Status: DC
  Administered 2017-11-15 (×3): 1000 mg via ORAL
  Filled 2017-11-14 (×3): qty 2

## 2017-11-14 MED ORDER — KETOROLAC TROMETHAMINE 30 MG/ML IJ SOLN
INTRAMUSCULAR | Status: AC
  Filled 2017-11-14: qty 1

## 2017-11-14 MED ORDER — METHYLERGONOVINE MALEATE 0.2 MG/ML IJ SOLN
INTRAMUSCULAR | Status: AC
  Filled 2017-11-14: qty 1

## 2017-11-14 MED ORDER — NALBUPHINE HCL 10 MG/ML IJ SOLN: 5.0000 mg | INTRAMUSCULAR | Status: DC | PRN

## 2017-11-14 MED ORDER — METOCLOPRAMIDE HCL 5 MG/ML IJ SOLN
INTRAMUSCULAR | Status: AC
  Filled 2017-11-14: qty 2

## 2017-11-14 MED ORDER — TETANUS-DIPHTH-ACELL PERTUSSIS 5-2.5-18.5 LF-MCG/0.5 IM SUSP: 0.5000 mL | Freq: Once | INTRAMUSCULAR | Status: DC

## 2017-11-14 MED ORDER — SODIUM CHLORIDE 0.9 % IR SOLN
Status: DC | PRN
  Administered 2017-11-14: 1

## 2017-11-14 MED ORDER — ONDANSETRON HCL 4 MG/2ML IJ SOLN
INTRAMUSCULAR | Status: DC | PRN
  Administered 2017-11-14: 4 mg via INTRAVENOUS

## 2017-11-14 MED ORDER — OXYTOCIN 10 UNIT/ML IJ SOLN
INTRAMUSCULAR | Status: DC | PRN
  Administered 2017-11-14: 40 [IU] via INTRAMUSCULAR

## 2017-11-14 MED ORDER — STERILE WATER FOR IRRIGATION IR SOLN
Status: DC | PRN
  Administered 2017-11-14: 1000 mL

## 2017-11-14 MED ORDER — FENTANYL CITRATE (PF) 100 MCG/2ML IJ SOLN: 25.0000 ug | INTRAMUSCULAR | Status: DC | PRN

## 2017-11-14 MED ORDER — OXYTOCIN 40 UNITS IN LACTATED RINGERS INFUSION - SIMPLE MED: 2.5000 [IU]/h | INTRAVENOUS | Status: DC

## 2017-11-14 MED ORDER — MEASLES, MUMPS & RUBELLA VAC ~~LOC~~ INJ: 0.5000 mL | INJECTION | Freq: Once | SUBCUTANEOUS | Status: DC

## 2017-11-14 MED ORDER — COCONUT OIL OIL
1.0000 "application " | TOPICAL_OIL | Status: DC | PRN
  Administered 2017-11-14: 1 via TOPICAL
  Filled 2017-11-14: qty 120

## 2017-11-14 MED ORDER — LACTATED RINGERS IV SOLN
INTRAVENOUS | Status: DC | PRN
Start: 2017-11-14 — End: 2017-11-14
  Administered 2017-11-14: 16:00:00 via INTRAVENOUS

## 2017-11-14 MED ORDER — KETOROLAC TROMETHAMINE 30 MG/ML IJ SOLN: 30.0000 mg | Freq: Four times a day (QID) | INTRAMUSCULAR | Status: AC | PRN

## 2017-11-14 MED ORDER — ENOXAPARIN SODIUM 40 MG/0.4ML ~~LOC~~ SOLN
40.0000 mg | SUBCUTANEOUS | Status: DC
  Administered 2017-11-15: 40 mg via SUBCUTANEOUS
  Filled 2017-11-14: qty 0.4

## 2017-11-14 MED ORDER — PHENYLEPHRINE 40 MCG/ML (10ML) SYRINGE FOR IV PUSH (FOR BLOOD PRESSURE SUPPORT)
PREFILLED_SYRINGE | INTRAVENOUS | Status: AC
  Filled 2017-11-14: qty 10

## 2017-11-14 MED ORDER — MORPHINE SULFATE (PF) 0.5 MG/ML IJ SOLN
INTRAMUSCULAR | Status: DC | PRN
  Administered 2017-11-14: 1 mg via INTRAVENOUS
  Administered 2017-11-14: 4 mg via EPIDURAL

## 2017-11-14 MED ORDER — METOCLOPRAMIDE HCL 5 MG/ML IJ SOLN
INTRAMUSCULAR | Status: DC | PRN
  Administered 2017-11-14 (×2): 5 mg via INTRAVENOUS

## 2017-11-14 MED ORDER — NALOXONE HCL 0.4 MG/ML IJ SOLN: 0.4000 mg | INTRAMUSCULAR | Status: DC | PRN

## 2017-11-14 MED ORDER — MAGNESIUM HYDROXIDE 400 MG/5ML PO SUSP: 30.0000 mL | ORAL | Status: DC | PRN

## 2017-11-14 MED ORDER — SODIUM BICARBONATE 8.4 % IV SOLN
INTRAVENOUS | Status: AC
  Filled 2017-11-14: qty 50

## 2017-11-14 MED ORDER — PROMETHAZINE HCL 25 MG/ML IJ SOLN: 6.2500 mg | INTRAMUSCULAR | Status: DC | PRN

## 2017-11-14 MED ORDER — ZOLPIDEM TARTRATE 5 MG PO TABS
5.0000 mg | ORAL_TABLET | Freq: Every evening | ORAL | Status: DC | PRN
Start: 2017-11-14 — End: 2017-11-16

## 2017-11-14 MED ORDER — METHYLERGONOVINE MALEATE 0.2 MG/ML IJ SOLN
INTRAMUSCULAR | Status: DC | PRN
  Administered 2017-11-14: 0.2 mg via INTRAMUSCULAR

## 2017-11-14 MED ORDER — SODIUM BICARBONATE 8.4 % IV SOLN
INTRAVENOUS | Status: DC | PRN
  Administered 2017-11-14 (×3): 5 mL via EPIDURAL

## 2017-11-14 MED ORDER — SODIUM CHLORIDE 0.9% FLUSH: 3.0000 mL | INTRAVENOUS | Status: DC | PRN

## 2017-11-14 MED ORDER — OXYTOCIN 10 UNIT/ML IJ SOLN
INTRAMUSCULAR | Status: AC
  Filled 2017-11-14: qty 4

## 2017-11-14 MED ORDER — SIMETHICONE 80 MG PO CHEW: 80.0000 mg | CHEWABLE_TABLET | ORAL | Status: DC | PRN

## 2017-11-14 MED ORDER — PRENATAL MULTIVITAMIN CH
1.0000 | ORAL_TABLET | Freq: Every day | ORAL | Status: DC
  Administered 2017-11-15 – 2017-11-16 (×2): 1 via ORAL
  Filled 2017-11-14 (×2): qty 1

## 2017-11-14 MED ORDER — MORPHINE SULFATE (PF) 0.5 MG/ML IJ SOLN
INTRAMUSCULAR | Status: AC
  Filled 2017-11-14: qty 10

## 2017-11-14 MED ORDER — CEFAZOLIN SODIUM-DEXTROSE 2-4 GM/100ML-% IV SOLN: 2.0000 g | Freq: Once | INTRAVENOUS | Status: DC

## 2017-11-14 MED ORDER — OXYCODONE-ACETAMINOPHEN 5-325 MG PO TABS: 2.0000 | ORAL_TABLET | ORAL | Status: DC | PRN

## 2017-11-14 MED ORDER — SIMETHICONE 80 MG PO CHEW
80.0000 mg | CHEWABLE_TABLET | ORAL | Status: DC
  Administered 2017-11-15 (×2): 80 mg via ORAL
  Filled 2017-11-14 (×2): qty 1

## 2017-11-14 MED ORDER — NALOXONE HCL 4 MG/10ML IJ SOLN: 1.0000 ug/kg/h | INTRAVENOUS | Status: DC | PRN

## 2017-11-14 MED ORDER — ONDANSETRON HCL 4 MG/2ML IJ SOLN
INTRAMUSCULAR | Status: AC
  Filled 2017-11-14: qty 2

## 2017-11-14 SURGICAL SUPPLY — 34 items
CHLORAPREP W/TINT 26ML (MISCELLANEOUS) ×3 IMPLANT
CLAMP CORD UMBIL (MISCELLANEOUS) IMPLANT
CLOTH BEACON ORANGE TIMEOUT ST (SAFETY) ×3 IMPLANT
DERMABOND ADVANCED (GAUZE/BANDAGES/DRESSINGS) ×2
DERMABOND ADVANCED .7 DNX12 (GAUZE/BANDAGES/DRESSINGS) ×1 IMPLANT
DRSG OPSITE POSTOP 4X10 (GAUZE/BANDAGES/DRESSINGS) ×3 IMPLANT
ELECT REM PT RETURN 9FT ADLT (ELECTROSURGICAL) ×3
ELECTRODE REM PT RTRN 9FT ADLT (ELECTROSURGICAL) ×1 IMPLANT
EXTRACTOR VACUUM M CUP 4 TUBE (SUCTIONS) IMPLANT
EXTRACTOR VACUUM M CUP 4' TUBE (SUCTIONS)
GLOVE BIOGEL PI IND STRL 7.0 (GLOVE) ×3 IMPLANT
GLOVE BIOGEL PI INDICATOR 7.0 (GLOVE) ×6
GLOVE ECLIPSE 7.0 STRL STRAW (GLOVE) ×3 IMPLANT
GOWN STRL REUS W/TWL LRG LVL3 (GOWN DISPOSABLE) ×6 IMPLANT
KIT ABG SYR 3ML LUER SLIP (SYRINGE) IMPLANT
NEEDLE HYPO 22GX1.5 SAFETY (NEEDLE) ×3 IMPLANT
NEEDLE HYPO 25X5/8 SAFETYGLIDE (NEEDLE) ×3 IMPLANT
NS IRRIG 1000ML POUR BTL (IV SOLUTION) ×3 IMPLANT
PACK C SECTION WH (CUSTOM PROCEDURE TRAY) ×3 IMPLANT
PAD ABD 7.5X8 STRL (GAUZE/BANDAGES/DRESSINGS) ×3 IMPLANT
PAD ABD 8X7 1/2 STERILE (GAUZE/BANDAGES/DRESSINGS) ×3 IMPLANT
PAD OB MATERNITY 4.3X12.25 (PERSONAL CARE ITEMS) ×3 IMPLANT
PENCIL SMOKE EVAC W/HOLSTER (ELECTROSURGICAL) ×3 IMPLANT
RTRCTR C-SECT PINK 25CM LRG (MISCELLANEOUS) IMPLANT
SPONGE GAUZE 4X4 12PLY (GAUZE/BANDAGES/DRESSINGS) ×9 IMPLANT
SUT PDS AB 0 CTX 36 PDP370T (SUTURE) IMPLANT
SUT PLAIN 2 0 XLH (SUTURE) IMPLANT
SUT VIC AB 0 CTX 36 (SUTURE) ×4
SUT VIC AB 0 CTX36XBRD ANBCTRL (SUTURE) ×2 IMPLANT
SUT VIC AB 4-0 KS 27 (SUTURE) ×3 IMPLANT
SYR CONTROL 10ML LL (SYRINGE) ×3 IMPLANT
TAPE CLOTH SURG 4X10 WHT LF (GAUZE/BANDAGES/DRESSINGS) ×3 IMPLANT
TOWEL OR 17X24 6PK STRL BLUE (TOWEL DISPOSABLE) ×3 IMPLANT
TRAY FOLEY W/BAG SLVR 14FR LF (SET/KITS/TRAYS/PACK) ×3 IMPLANT

## 2017-11-14 NOTE — Progress Notes (Signed)
LABOR PROGRESS NOTE  Anita Love is a 22 y.o. G1P0 at [redacted]w[redacted]d  admitted for SOL- latent labor  Subjective: Patient doing well, comfortable with epidural- not feeling any pressure in bottom  Objective: BP (!) 107/54   Pulse 91   Temp 99 F (37.2 C) (Oral)   Resp 16   Wt 154 lb 8 oz (70.1 kg)   LMP 02/07/2017 (Exact Date)   SpO2 97%   BMI 29.19 kg/m  or  Vitals:   11/14/17 1108 11/14/17 1131 11/14/17 1201 11/14/17 1231  BP: 104/60 104/61 (!) 121/96 (!) 107/54  Pulse: (!) 107 97 80 91  Resp:  Temp:      TempSrc:      SpO2:      Weight:        Dilation: 8 Effacement (%): 80 Cervical Position: Middle Station: -1 Presentation: Vertex Exam by:: Ardis Hughs FHT: baseline rate 145, moderate varibility, +acel, no decel Toco: 2-5/ moderate by palpation  IUPC: MVU 150  Labs: Lab Results  Component Value Date   WBC 12.0 (H) 11/13/2017   HGB 15.1 (H) 11/13/2017   HCT 45.6 11/13/2017   MCV 94.0 11/13/2017   PLT 148 (L) 11/13/2017    Patient Active Problem List   Diagnosis Date Noted  . Labor and delivery indication for care or intervention 11/13/2017  . Encounter for supervision of normal pregnancy 07/12/2017  . High risk teen pregnancy 07/12/2017    Assessment / Plan: 22 y.o. G1P0 at [redacted]w[redacted]d here for SOL- latent labor   Labor: Slow progress, continue pitocin titration to adequate MVU  Fetal Wellbeing:  Cat I Pain Control:  Epidural  Anticipated MOD:  SVD  Sharyon Cable, CNM 11/14/2017, 12:39 PM

## 2017-11-14 NOTE — Op Note (Signed)
Anita Gamblin PROCEDURE DATE: 11/14/2017  PREOPERATIVE DIAGNOSES: Intrauterine pregnancy at [redacted]w[redacted]d weeks gestation; failure to progress: arrest of dilation and intrauterine infection  POSTOPERATIVE DIAGNOSES: The same  PROCEDURE: Primary Low Transverse Cesarean Section  SURGEON:  Dr. Jaynie Collins  ASSISTANT:  Webb Silversmith, RNFA.  An experienced assistant was required given the standard of surgical care given the complexity of the case.  This assistant was needed for exposure, dissection, suctioning, retraction, instrument exchange, assisting with delivery with administration of fundal pressure, and for overall help during the procedure.  ANESTHESIOLOGY TEAM: Anesthesiologist: Marcene Duos, MD; Achille Rich, MD; Cecile Hearing, MD CRNA: Shanon Payor, CRNA  INDICATIONS: Anita Love is a 22 y.o. G1P1001 at [redacted]w[redacted]d here for cesarean section secondary to the indications listed under preoperative diagnoses; please see preoperative note for further details.  The risks of cesarean section were discussed with the patient including but were not limited to: bleeding which may require transfusion or reoperation; infection which may require antibiotics; injury to bowel, bladder, ureters or other surrounding organs; injury to the fetus; need for additional procedures including hysterectomy in the event of a life-threatening hemorrhage; placental abnormalities wth subsequent pregnancies, incisional problems, thromboembolic phenomenon and other postoperative/anesthesia complications.   The patient concurred with the proposed plan, giving informed written consent for the procedure.    FINDINGS:  Viable female infant in cephalic presentation.  Apgars 8 and 9.  Scant clear amniotic fluid.  Intact placenta, three vessel cord.  Normal uterus, fallopian tubes and ovaries bilaterally. Edematous bilateral broad ligaments noted. Significant uterine atony noted after delivery of fetus, pitocin and  one dose of Methergine was given.   ANESTHESIA: Epidural  INTRAVENOUS FLUIDS: 2400 ml   ESTIMATED BLOOD LOSS: 1286 ml URINE OUTPUT:  200 ml SPECIMENS: Placenta sent to pathology COMPLICATIONS: None immediate  PROCEDURE IN DETAIL:  The patient preoperatively received intravenous antibiotics and had sequential compression devices applied to her lower extremities.  She was then taken to the operating room where the epidural anesthesia was dosed up to surgical level and was found to be adequate. She was then placed in a dorsal supine position with a leftward tilt, and prepped and draped in a sterile manner.  A foley catheter was placed into her bladder and attached to constant gravity.  After an adequate timeout was performed, a Pfannenstiel skin incision was made with scalpel and carried through to the underlying layer of fascia. The fascia was incised in the midline, and this incision was extended bilaterally using the Mayo scissors.  Kocher clamps were applied to the superior aspect of the fascial incision and the underlying rectus muscles were dissected off bluntly.  A similar process was carried out on the inferior aspect of the fascial incision. The rectus muscles were separated in the midline and the peritoneum was entered bluntly. The Alexis self-retaining retractor was introduced into the abdominal cavity.  Attention was turned to the lower uterine segment where a low transverse hysterotomy was made with a scalpel and extended bilaterally bluntly.  The infant was successfully delivered, the cord was clamped and cut after one minute, and the infant was handed over to the awaiting neonatology team. Uterine massage was then administered, and the placenta delivered intact with a three-vessel cord. The uterus was then cleared of clots and debris.  The hysterotomy was closed with 0 Vicryl in a running locked fashion, and an imbricating layer was also placed with 0 Vicryl.   The pelvis was cleared of all clot  and  debris. Hemostasis was confirmed on all surfaces.  The retractor was removed.  The peritoneum was closed with a 0 Vicryl running stitch.  The fascia was then closed using 0 Vicryl in a running fashion.  The subcutaneous layer was irrigated, and the skin was closed with a 4-0 Vicryl subcuticular stitch. The patient tolerated the procedure well. Sponge, instrument and needle counts were correct x 3.  She was taken to the recovery room in stable condition.    Jaynie Collins, MD, FACOG Obstetrician & Gynecologist, [facility] for [employer], [facility]

## 2017-11-14 NOTE — Transfer of Care (Signed)
Immediate Anesthesia Transfer of Care Note  Patient: Anita Love  Procedure(s) Performed: CESAREAN SECTION (N/A Abdomen)  Patient Location: PACU  Anesthesia Type:Epidural  Level of Consciousness: awake, alert  and oriented  Airway & Oxygen Therapy: Patient Spontanous Breathing  Post-op Assessment: Report given to RN and Post -op Vital signs reviewed and stable  Post vital signs: Reviewed and stable  Last Vitals:  Vitals Value Taken Time  BP    Temp    Pulse 96 11/14/2017  5:11 PM  Resp 10 11/14/2017  5:11 PM  SpO2 96 % 11/14/2017  5:11 PM  Vitals shown include unvalidated device data.  Last Pain:  Vitals:   11/14/17 1546  TempSrc: Axillary  PainSc: 0-No pain         Complications: No apparent anesthesia complications

## 2017-11-14 NOTE — Progress Notes (Signed)
LABOR PROGRESS NOTE  Anita Love is a 22 y.o. G1P0 at [redacted]w[redacted]d  admitted for IOL for latent labor and nonreactive NST  Subjective: Sleeping  Objective: BP 106/76   Pulse 93   Temp 98.7 F (37.1 C) (Axillary)   Resp 16   Wt 70.1 kg (154 lb 8 oz)   LMP 02/07/2017 (Exact Date)   SpO2 97%   BMI 29.19 kg/m  or  Vitals:   11/13/17 2301 11/13/17 2331 11/14/17 0001 11/14/17 0031  BP: (!) 111/59 (!) 111/58 (!) 99/56 106/76  Pulse: (!) 102 (!) 106 98 93  Resp: Temp:   98.7 F (37.1 C)   TempSrc:   Axillary   SpO2:      Weight:         Dilation: 6 Effacement (%): 90 Cervical Position: Middle Station: -2 Presentation: Vertex Exam by:: [employer] RN FHT: 145 bpm, moderate variability, + accelerations, variable decelerations, no late decels. Uterine activity: q1-16min  Labs: Lab Results  Component Value Date   WBC 12.0 (H) 11/13/2017   HGB 15.1 (H) 11/13/2017   HCT 45.6 11/13/2017   MCV 94.0 11/13/2017   PLT 148 (L) 11/13/2017    Patient Active Problem List   Diagnosis Date Noted  . Labor and delivery indication for care or intervention 11/13/2017  . Encounter for supervision of normal pregnancy 07/12/2017  . High risk teen pregnancy 07/12/2017    Assessment / Plan: 22 y.o. G1P0 at [redacted]w[redacted]d here for IOL for latent labor and nonreactive NST  Labor: pitocin Fetal Wellbeing:  Cat II Pain Control:  epidural Anticipated MOD:  SVD  Leland Her, DO PGY-2 5/23/20191:21 AM

## 2017-11-14 NOTE — Lactation Note (Signed)
This note was copied from a baby's chart. Lactation Consultation Note  Patient Name: Anita Love Today's Date: 11/14/2017 Reason for consult: Initial assessment;Primapara;1st time breastfeeding;Term  P1 mother whose infant is now 94 hours old.  Baby is sleeping in family member's arms.  Mother stated that she feels like breastfeeding is going well so far.  The latch is good and without pain.  Mother has no questions/concerns at the present time.  Reviewed feeding 8-12 times/24 hours or earlier if baby shows feeding cues, STS, breast massage and hand expression before and after feedings and to awaken baby at the 3 hour interval if she does not self awaken.  Mom made aware of O/P services, breastfeeding support groups, community resources, and our phone # for post-discharge questions. Family present.   Maternal Data Formula Feeding for Exclusion: No Has patient been taught Hand Expression?: Yes Does the patient have breastfeeding experience prior to this delivery?: No(Took a breastfeeding class)  Feeding Feeding Type: Breast Fed Length of feed: 30 min  LATCH Score Latch: Grasps breast easily, tongue down, lips flanged, rhythmical sucking.  Audible Swallowing: Spontaneous and intermittent  Type of Nipple: Everted at rest and after stimulation  Comfort (Breast/Nipple): Soft / non-tender  Hold (Positioning): Assistance needed to correctly position infant at breast and maintain latch.  LATCH Score: 9  Interventions Interventions: Breast feeding basics reviewed;Assisted with latch;Skin to skin;Hand express;Adjust position;Support pillows  Lactation Tools Discussed/Used WIC Program: Yes   Consult Status Consult Status: Follow-up Date: 11/15/17 Follow-up type: In-patient    [place] Jenifer R Tytiana Coles 11/14/2017, 9:02 PM

## 2017-11-14 NOTE — Progress Notes (Signed)
LABOR PROGRESS NOTE  Anita Love is a 22 y.o. G1P0 at [redacted]w[redacted]d  admitted for SOL.  Subjective: Patient doing well, comfortable with epidural. Just had a temperature of 102.78; has been AROM for over 24 hours.  Objective: BP (!) 113/57   Pulse (!) 125   Temp (!) 102.7 F (39.3 C) (Axillary)   Resp 18   Wt 154 lb 8 oz (70.1 kg)   LMP 02/07/2017 (Exact Date)   SpO2 97%   BMI 29.19 kg/m  or  Patient Vitals for the past 24 hrs:  BP Temp Temp src Pulse Resp  11/14/17 1546 (!) 113/57 (!) 102.7 F (39.3 C) Axillary (!) 125 18  11/14/17 1541 109/74 - - (!) 124 -  11/14/17 1531 (!) 89/74 - - - 16  11/14/17 1502 112/74 - - (!) 108 18  11/14/17 1432 91/69 - - (!) 152 16  11/14/17 1402 104/65 100 F (37.8 C) Oral (!) 104 18  11/14/17 1331 - - - - 16  11/14/17 1301 - 100 F (37.8 C) Oral - 18  11/14/17 1231 (!) 107/54 - - 91 16  11/14/17 1201 (!) 121/96 - - 80 18  11/14/17 1131 104/61 - - 97 16  11/14/17 1108 104/60 - - (!) 107 -  11/14/17 1101 - 99 F (37.2 C) Oral - 20  11/14/17 1031 116/75 - - (!) 118 18  11/14/17 1001 (!) 114/50 - - (!) 105 16  11/14/17 0931 102/66 - - 93 16  11/14/17 0901 96/74 99.1 F (37.3 C) Oral (!) 106 20  11/14/17 0831 102/69 - - 95 18  11/14/17 0801 (!) 98/59 - - 95 16  11/14/17 0731 (!) 103/55 - - 89 18  11/14/17 0701 106/63 - - 87 16  11/14/17 0652 113/64 98 F (36.7 C) Axillary 94 18  11/14/17 0500 102/66 98.1 F (36.7 C) Oral (!) 103 20  11/14/17 0431 100/84 - - 100 18  11/14/17 0357 107/61 - - (!) 103 20  11/14/17 0331 (!) 97/58 - - 93 -  11/14/17 0301 (!) 107/57 99.7 F (37.6 C) - 93 16  11/14/17 0231 (!) 100/55 - - 84 16  11/14/17 0227 97/61 - - 86 18  11/14/17 0131 124/74 - - (!) 108 16  11/14/17 0101 (!) 103/54 - - 100 16  11/14/17 0031 106/76 - - 93 16  11/14/17 0001 (!) 99/56 98.7 F (37.1 C) Axillary 98 16  11/13/17 2331 (!) 111/58 - - (!) 106 16  11/13/17 2301 (!) 111/59 - - (!) 102 16  11/13/17 2231 99/66 - - (!) 105 16   11/13/17 2201 115/70 - - (!) 105 18  11/13/17 2135 121/83 - - 95 16  11/13/17 2101 (!) 123/95 98.9 F (37.2 C) Oral 94 16  11/13/17 2031 117/63 - - (!) 105 18  11/13/17 2001 100/68 - - 94 16  11/13/17 1932 108/79 99.3 F (37.4 C) Oral 96 18  11/13/17 1901 112/69 - - 94 -  11/13/17 1831 96/71 - - 89 -  11/13/17 1801 96/60 - - 86 16  11/13/17 1731 108/66 - - (!) 102 18  11/13/17 1700 116/65 98.5 F (36.9 C) Oral 95 16  11/13/17 1631 103/63 - - 83 18   IUPC replaced @ 1354 , currently on 30 milli-unit of pitocin. Not working well.  Dilation: 8 Effacement (%): 80 Cervical Position: Middle Station: -1 Presentation: Vertex Exam by:: Ardis Hughs FHT: baseline rate 160, minimal-moderate varibility, +accel, no decel Toco: 2-3  minutes, MVU 120-140   Labs: Lab Results  Component Value Date   WBC 12.0 (H) 11/13/2017   HGB 15.1 (H) 11/13/2017   HCT 45.6 11/13/2017   MCV 94.0 11/13/2017   PLT 148 (L) 11/13/2017    Patient Active Problem List   Diagnosis Date Noted  . Intrauterine infection during labor 11/14/2017  . S/P cesarean section for FTP 11/13/2017  . Encounter for supervision of normal pregnancy 07/12/2017  . High risk teen pregnancy 07/12/2017    Assessment / Plan: 22 y.o. G1P0 at 108w0d here for SOL. Category I FHR tracing. Current intrauterine infection. No change in cervical dilation since about 0700 this morning despite pitocin going up to levels of 30 mu/min. Very high fetal station concerning for possible CPD.  Patient was counseled about cesarean delivery for failure of cervical dilation. The risks of cesarean section discussed with the patient included but were not limited to: bleeding which may require transfusion or reoperation; worsening infection which may require antibiotics; injury to bowel, bladder, ureters or other surrounding organs; injury to the fetus; need for additional procedures including hysterectomy in the event of a life-threatening hemorrhage;  placental abnormalities wth subsequent pregnancies, incisional problems, thromboembolic phenomenon and other postoperative/anesthesia complications. The patient concurred with the proposed plan, giving informed written consent for the procedure.   Anesthesia and OR aware. Preoperative prophylactic antibiotics (Gentamicin/Clindamycin and Azithromycin; these antibiotics chosen for ongoing Triple I and SCIP as per Pharmacy consult) and SCDs ordered on call to the OR.  To OR when ready.   Tereso Newcomer, MD 11/14/2017, 4:04 PM

## 2017-11-14 NOTE — Progress Notes (Signed)
LABOR PROGRESS NOTE  Anita Love is a 22 y.o. G1P0 at [redacted]w[redacted]d  admitted for SOL.  Subjective: Patient doing well, comfortable with epidural   Objective: BP 91/69   Pulse (!) 152   Temp 100 F (37.8 C) (Oral)   Resp 16   Wt 154 lb 8 oz (70.1 kg)   LMP 02/07/2017 (Exact Date)   SpO2 97%   BMI 29.19 kg/m  or  Vitals:   11/14/17 1301 11/14/17 1331 11/14/17 1402 11/14/17 1432  BP:   104/65 91/69  Pulse:   (!) 104 (!) 152  Resp: Temp: 100 F (37.8 C)  100 F (37.8 C)   TempSrc: Oral  Oral   SpO2:      Weight:        IUPC replaced @ 1354 , currently on 24 milli-unit of pitocin  Dilation: 8 Effacement (%): 80 Cervical Position: Middle Station: -1 Presentation: Vertex Exam by:: Ardis Hughs FHT: baseline rate 145, minimal-moderate varibility, +acel, no decel Toco: 1-3 minutes, MVU 120-140   Labs: Lab Results  Component Value Date   WBC 12.0 (H) 11/13/2017   HGB 15.1 (H) 11/13/2017   HCT 45.6 11/13/2017   MCV 94.0 11/13/2017   PLT 148 (L) 11/13/2017    Patient Active Problem List   Diagnosis Date Noted  . Labor and delivery indication for care or intervention 11/13/2017  . Encounter for supervision of normal pregnancy 07/12/2017  . High risk teen pregnancy 07/12/2017    Assessment / Plan: 22 y.o. G1P0 at [redacted]w[redacted]d here for SOL  Labor: Slow to progress, MVU not adequate- continue pitocin titration to 53milli-unit/minute then reassess cervix. Patient repositioned with peanut.  Fetal Wellbeing:  Cat I Pain Control:  Epidural  Anticipated MOD:  Hopeful SVD  Sharyon Cable, CNM 11/14/2017, 2:49 PM

## 2017-11-14 NOTE — Telephone Encounter (Signed)
Spoke with patient regarding her call, disregard question she is now in labor.

## 2017-11-14 NOTE — Progress Notes (Signed)
LABOR PROGRESS NOTE  Anita Love is a 22 y.o. G1P0 at [redacted]w[redacted]d  admitted for IOL for latent labor and nonreactive NST  Subjective: Patient report feeling increased pressure  Objective: BP 107/61   Pulse (!) 103   Temp 99.7 F (37.6 C)   Resp 20   Wt 154 lb 8 oz (70.1 kg)   LMP 02/07/2017 (Exact Date)   SpO2 97%   BMI 29.19 kg/m  or  Vitals:   11/14/17 0231 11/14/17 0301 11/14/17 0331 11/14/17 0357  BP: (!) 100/55 (!) 107/57 (!) 97/58 107/61  Pulse: 84 93 93 (!) 103  Resp: Temp:  99.7 F (37.6 C)    TempSrc:      SpO2:      Weight:        SVE: Dilation: 6 Effacement (%): 90 Cervical Position: Middle Station: -1 Presentation: Vertex Exam by:: savannah brendle RN   FHT: baseline rate 150, moderate varibility, +acel, occasional variable decels Toco: ctx q ~2 min min  Assessment / Plan:  Labor: Minimal cervical change. IUPC placed, continue to titrate Pitocin Fetal Wellbeing:  Cat II Pain Control:  Well-controlled with epiudral Anticipated MOD:  SVD  Frederik Pear, MD 11/14/2017, 4:32 AM

## 2017-11-15 LAB — CBC
HCT: 38.2 % (ref 36.0–46.0)
Hemoglobin: 13.2 g/dL (ref 12.0–15.0)
MCH: 31.9 pg (ref 26.0–34.0)
MCHC: 34.6 g/dL (ref 30.0–36.0)
MCV: 92.3 fL (ref 78.0–100.0)
PLATELETS: 126 10*3/uL — AB (ref 150–400)
RBC: 4.14 MIL/uL (ref 3.87–5.11)
RDW: 14.3 % (ref 11.5–15.5)
WBC: 17.7 10*3/uL — ABNORMAL HIGH (ref 4.0–10.5)

## 2017-11-15 LAB — CREATININE, SERUM
CREATININE: 1.17 mg/dL — AB (ref 0.44–1.00)
GFR calc Af Amer: >60 mL/min (ref >60)

## 2017-11-15 NOTE — Progress Notes (Addendum)
Subjective: Postpartum Day 1: Cesarean Delivery Patient reports tolerating PO, + flatus and no problems voiding.    Objective: Vital signs in last 24 hours: Temp:  [97.7 F (36.5 C)-102.7 F (39.3 C)] 97.7 F (36.5 C) (05/24 0530) Pulse Rate:  [71-152] 71 (05/24 0530) Resp:  [10-28] 20 (05/24 0530) BP: (89-136)/(50-96) 115/82 (05/24 0530) SpO2:  [92 %-100 %] 100 % (05/24 0530)  Physical Exam:  General: alert, cooperative, appears stated age and no distress Lochia: appropriate Uterine Fundus: firm Incision: healing well, no significant drainage, no significant erythema DVT Evaluation: No evidence of DVT seen on physical exam.  Recent Labs    11/13/17 0945 11/15/17 0516  HGB 15.1* 13.2  HCT 45.6 38.2    Assessment/Plan: Status post Cesarean section. Doing well postoperatively.  Continue current care. Plan to discharge home tomorrow  Vira Browns 11/15/2017, 7:24 AM   I have spoken with and examined this patient and agree with resident/PA-S/Med-S/SNM's note and plan of care. VSS, HRR&R, Resp unlabored, Legs neg.  Nigel Berthold, CNM 11/20/2017 4:56 PM

## 2017-11-15 NOTE — Anesthesia Postprocedure Evaluation (Signed)
Anesthesia Post Note  Patient: Anita Love  Procedure(s) Performed: CESAREAN SECTION (N/A Abdomen)     Patient location during evaluation: Mother Baby Anesthesia Type: Epidural Level of consciousness: awake Pain management: pain level controlled Vital Signs Assessment: post-procedure vital signs reviewed and stable Respiratory status: spontaneous breathing Cardiovascular status: stable Postop Assessment: epidural receding and patient able to bend at knees Anesthetic complications: no    Last Vitals:  Vitals:   11/15/17 0100 11/15/17 0530  BP: 127/81 115/82  Pulse: 86 71  Resp: 18 20  Temp: 37.2 C 36.5 C  SpO2: 100% 100%    Last Pain:  Vitals:   11/15/17 0530  TempSrc: Oral  PainSc: 0-No pain   Pain Goal:                 Edison Pace

## 2017-11-16 LAB — BASIC METABOLIC PANEL
ANION GAP: 11 (ref 5–15)
BUN: 10 mg/dL (ref 6–20)
CHLORIDE: 105 mmol/L (ref 101–111)
CO2: 21 mmol/L — AB (ref 22–32)
Calcium: 8.4 mg/dL — ABNORMAL LOW (ref 8.9–10.3)
Creatinine, Ser: 0.93 mg/dL (ref 0.44–1.00)
GFR calc non Af Amer: >60 mL/min (ref >60)
Glucose, Bld: 103 mg/dL — ABNORMAL HIGH (ref 65–99)
POTASSIUM: 3.8 mmol/L (ref 3.5–5.1)
SODIUM: 137 mmol/L (ref 135–145)

## 2017-11-16 MED ORDER — IBUPROFEN 600 MG PO TABS: 600.0000 mg | ORAL_TABLET | Freq: Four times a day (QID) | ORAL | 0 refills | Status: DC

## 2017-11-16 MED ORDER — IBUPROFEN 600 MG PO TABS
600.0000 mg | ORAL_TABLET | Freq: Four times a day (QID) | ORAL | 0 refills | Status: DC
Start: 2017-11-16 — End: 2017-11-16

## 2017-11-16 MED ORDER — OXYCODONE-ACETAMINOPHEN 5-325 MG PO TABS: 1.0000 | ORAL_TABLET | Freq: Four times a day (QID) | ORAL | 0 refills | Status: AC | PRN

## 2017-11-16 MED ORDER — SENNOSIDES-DOCUSATE SODIUM 8.6-50 MG PO TABS: 2.0000 | ORAL_TABLET | ORAL | 0 refills | Status: DC

## 2017-11-16 MED ORDER — OXYCODONE-ACETAMINOPHEN 5-325 MG PO TABS: 1.0000 | ORAL_TABLET | Freq: Four times a day (QID) | ORAL | 0 refills | Status: DC | PRN

## 2017-11-16 NOTE — Discharge Summary (Addendum)
OB Discharge Summary     Patient Name: Anita Love DOB: 04-12-1996 MRN: [id number]  Date of admission: 11/13/2017 Delivering MD: Jaynie Collins A   Date of discharge: 11/16/2017  Admitting diagnosis: 39.5 WEEKS CTX Intrauterine pregnancy: [redacted]w[redacted]d     Secondary diagnosis:  Principal Problem:   S/P cesarean section for FTP Active Problems:   Intrauterine infection during labor  Additional problems: none     Discharge diagnosis: Term Pregnancy Delivered and PPH                                                                                               Post partum procedures:none  Augmentation: Pitocin  Complications: Intrauterine Inflammation or infection (Chorioamniotis) and Hemorrhage>1038mL  Hospital course:  Induction of Labor With Cesarean Section  22 y.o. yo G1P1001 at [redacted]w[redacted]d was admitted to the hospital 11/13/2017 for latent labor. Patient had a labor course significant for intrauterine infection; no change in cervical dilation for about 9h despite pitocin going up to levels of 30 mu/min, and very high fetal station concerning for possible CPD.  The patient went for cesarean section due to Arrest of Dilation, and delivered a Viable infant,11/14/2017  Membrane Rupture Time/Date: 3:31 PM ,11/13/2017   Details of operation can be found in separate operative Note.  Patient had an uncomplicated postpartum course. She is ambulating, tolerating a regular diet, passing flatus, and urinating well.  Patient is discharged home in stable condition on 11/16/17.                                    Physical exam  Vitals:   11/15/17 1300 11/15/17 1553 11/15/17 1855 11/15/17 1900  BP: 99/65 96/78 104/68   Pulse: 96 78 88   Resp: Temp: 97.6 F (36.4 C) 97.6 F (36.4 C) 97.6 F (36.4 C)   TempSrc: Oral Oral Oral   SpO2: 97% 100%    Weight:    70.1 kg (154 lb 8 oz)  Height:     (1.549 m)   General: alert, cooperative and no distress Lochia: appropriate Uterine Fundus:  firm Incision: Healing well with no significant drainage, No significant erythema, Dressing is clean, dry, and intact DVT Evaluation: No evidence of DVT seen on physical exam. Labs: Lab Results  Component Value Date   WBC 17.7 (H) 11/15/2017   HGB 13.2 11/15/2017   HCT 38.2 11/15/2017   MCV 92.3 11/15/2017   PLT 126 (L) 11/15/2017   CMP Latest Ref Rng & Units 11/15/2017  Glucose 70 - 99 mg/dL -  BUN 6 - 23 mg/dL -  Creatinine 0.16 - 0.10 mg/dL 9.32(T)  Sodium 557 - 322 mmol/L -  Potassium 3.5 - 5.1 mmol/L -  Chloride 96 - 112 mmol/L -  CO2 19 - 32 mmol/L -  Calcium 8.4 - 10.5 mg/dL -  Total Protein 6.0 - 8.3 g/dL -  Total Bilirubin 0.3 - 1.2 mg/dL -  Alkaline Phos 47 - 025 U/L -  AST 0 - 37 U/L -  ALT 0 - 35 U/L -    Discharge instruction: per After Visit Summary and "Baby and Me Booklet".  After visit meds:  Allergies as of 11/16/2017   No Known Allergies     Medication List    TAKE these medications   ibuprofen 600 MG tablet Commonly known as:  ADVIL,MOTRIN Take 1 tablet (600 mg total) by mouth every 6 (six) hours.   oxyCODONE-acetaminophen 5-325 MG tablet Commonly known as:  PERCOCET/ROXICET Take 1 tablet by mouth every 6 (six) hours as needed for up to 7 days (pain scale 4-7).   prenatal multivitamin Tabs tablet Take 1 tablet by mouth daily at 12 noon.       Diet: routine diet  Activity: Advance as tolerated. Pelvic rest for 6 weeks.   Outpatient follow up:2 weeks Follow up Appt: Future Appointments  Date Time Provider Department Center  11/28/2017 10:15 AM [facility] NURSE [facility]   Follow up Visit:No follow-ups on file.  Postpartum contraception: Nexplanon  Newborn Data: Live born female  Birth Weight: 8 lb 8.2 oz (3860 g) APGAR: 8, 9  Newborn Delivery   Birth date/time:  11/14/2017 16:19:00 Delivery type:  C-Section, Low Transverse Trial of labor:  Yes C-section categorization:  Primary     Baby Feeding: Breast Disposition:home with  mother   11/16/2017 Felisa Bonier, MD, PGY-1 Family Medicine [place]   OB FELLOW DISCHARGE ATTESTATION  I have seen and examined this patient. I agree with above documentation and have made edits as needed.   Caryl Ada, DO OB Fellow

## 2017-11-16 NOTE — Progress Notes (Signed)
Parent request formula to supplement breast feeding due to mom plan to supplement. Parents have been informed of small tummy size of newborn, taught hand expression and understands the possible consequences of formula to the health of the infant. The possible consequences shared with patient include 1) Loss of confidence in breastfeeding 2) Engorgement 3) Allergic sensitization of baby(asthma/allergies) and 4) decreased milk supply for mother.After discussion of the above the mother decided to to supplement The tool used to give formula supplement will be bottle.

## 2017-11-17 ENCOUNTER — Ambulatory Visit: Payer: Self-pay

## 2017-11-17 NOTE — Lactation Note (Signed)
This note was copied from a baby's chart. Lactation Consultation Note  Patient Name: Girl [place] Pilz Today's Date: 11/17/2017  Mom states she is pumping due to pain with latch.  Bottle feeding expressed milk/formula.  Mom has a pump at home.  Instructed to pump 8-12 times/24 hours.  Lactation outpatient services reviewed and encouraged prn.   Maternal Data    Feeding Feeding Type: Bottle Fed - Breast Milk Nipple Type: Slow - flow  LATCH Score                   Interventions    Lactation Tools Discussed/Used     Consult Status      Huston Foley 11/17/2017, 11:15 AM

## 2017-11-19 ENCOUNTER — Encounter: Payer: Self-pay | Admitting: *Deleted

## 2017-11-19 ENCOUNTER — Inpatient Hospital Stay (HOSPITAL_COMMUNITY): Admission: RE | Admit: 2017-11-19 | Payer: Medicaid Other | Source: Ambulatory Visit

## 2017-11-28 ENCOUNTER — Ambulatory Visit (INDEPENDENT_AMBULATORY_CARE_PROVIDER_SITE_OTHER): Payer: Medicaid Other | Admitting: General Practice

## 2017-11-28 VITALS — BP 106/73 | HR 86 | Ht 62.0 in | Wt 125.0 lb

## 2017-11-28 DIAGNOSIS — Z5189 Encounter for other specified aftercare: Secondary | ICD-10-CM

## 2017-11-28 NOTE — Progress Notes (Signed)
Patient presents to office today for incision check following primary c-section 2 weeks ago. Incision is clean, dry, & intact. Appears well healed. Patient to schedule pp visit for further follow up. Patient had no questions.

## 2017-11-28 NOTE — Progress Notes (Signed)
Patient seen and assessed by nursing staff.  Agree with documentation and plan.  

## 2018-11-04 IMAGING — US US MFM OB FOLLOW-UP
1 series · 14 of 24 positions shown · non-contrast
Comparison: none

[Series 1: us mfm ob follow-up · 24 acquisitions, 14 frames shown]
[im 1/24]
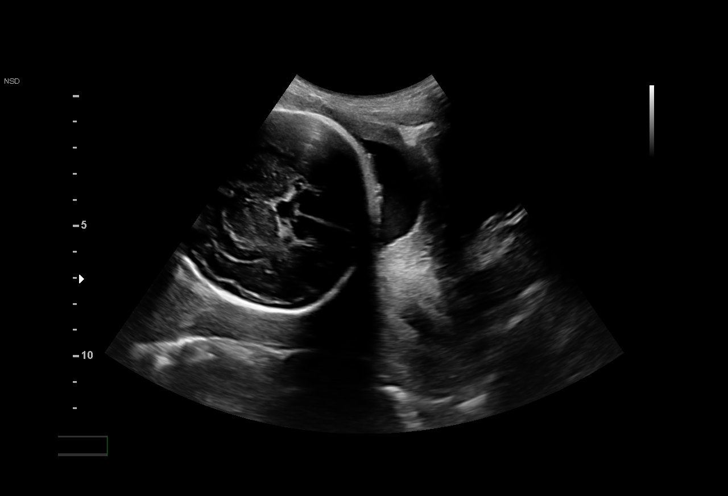
[im 3/24]
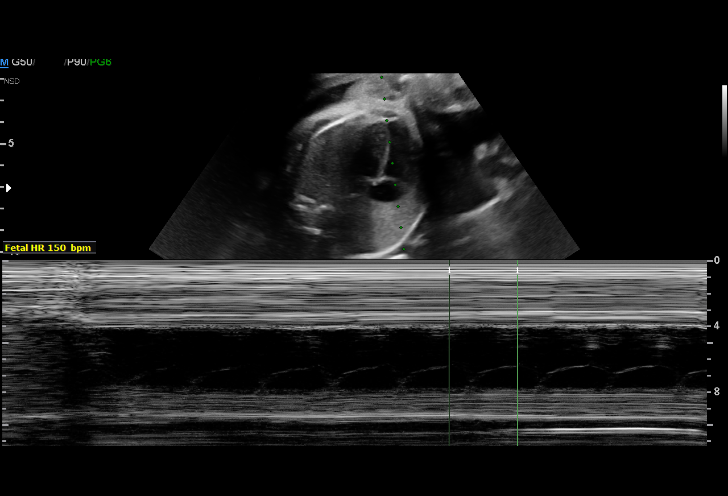
[im 5/24]
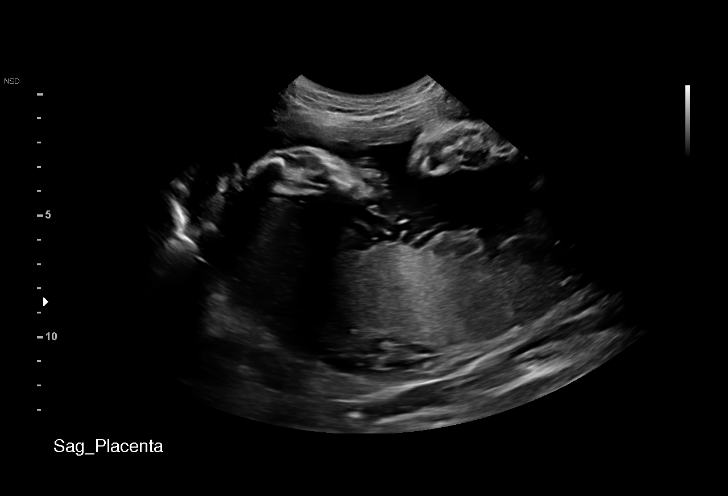
[im 7/24]
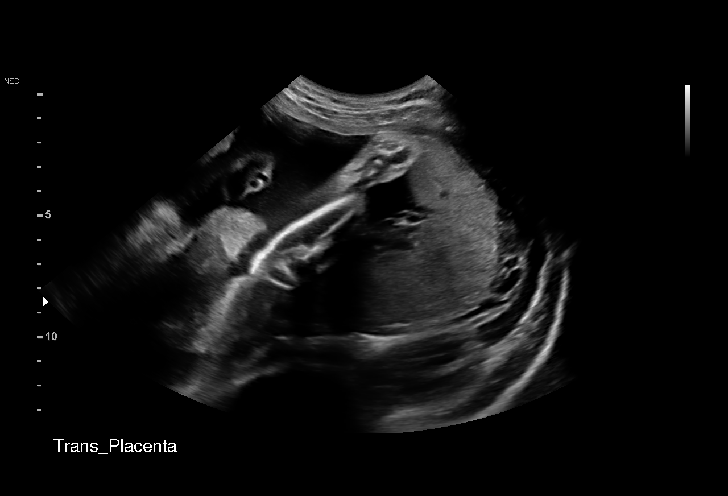
[im 8/24]
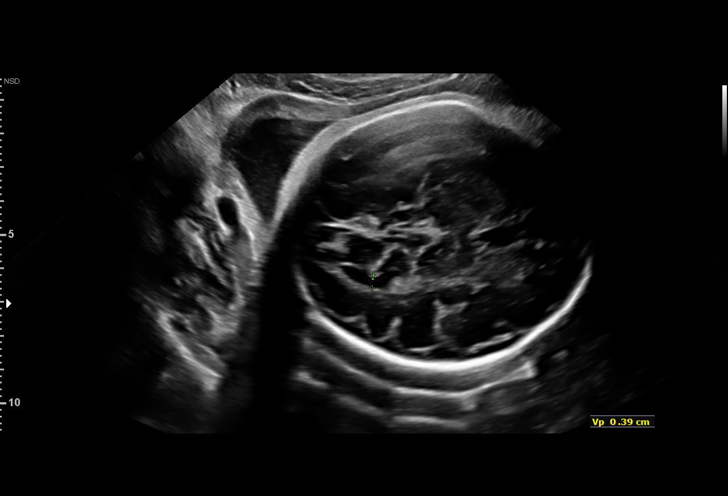
[im 10/24]
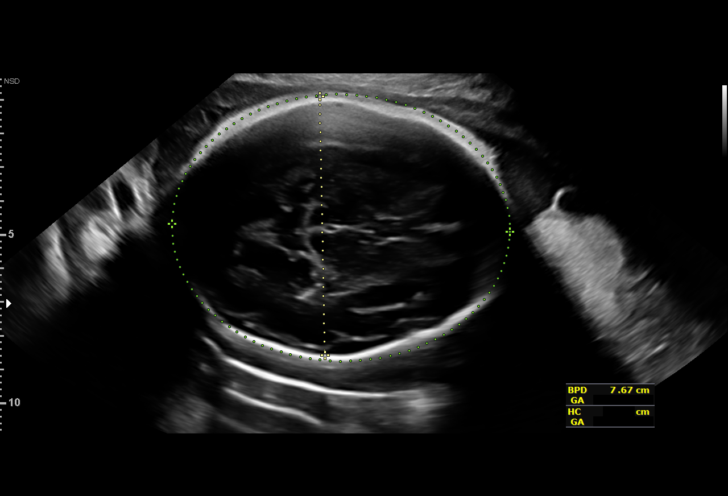
[im 12/24]
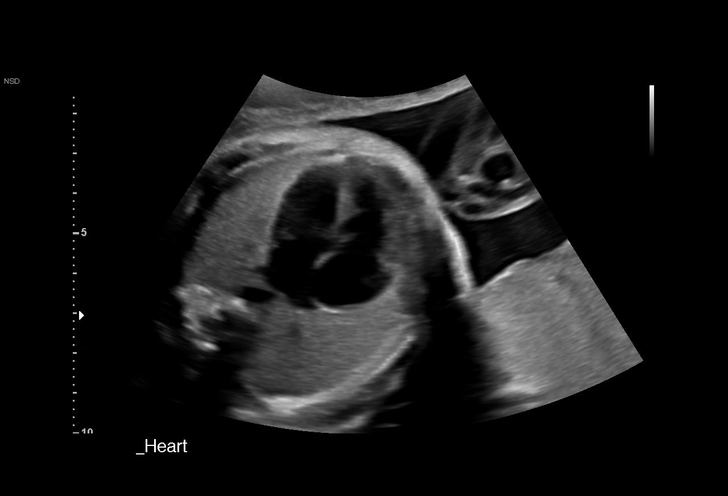
[im 13/24]
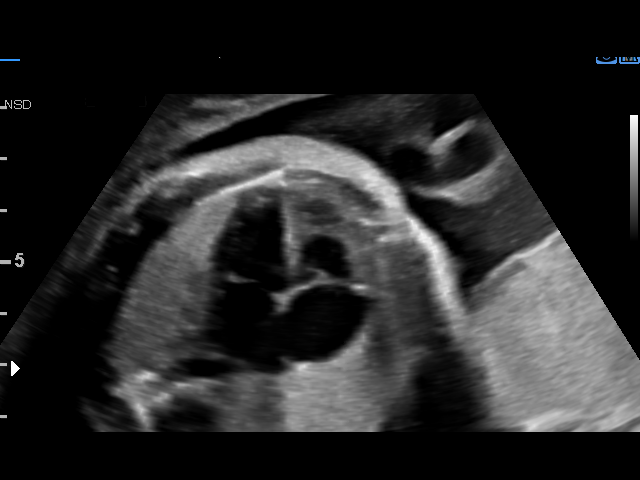
[im 15/24]
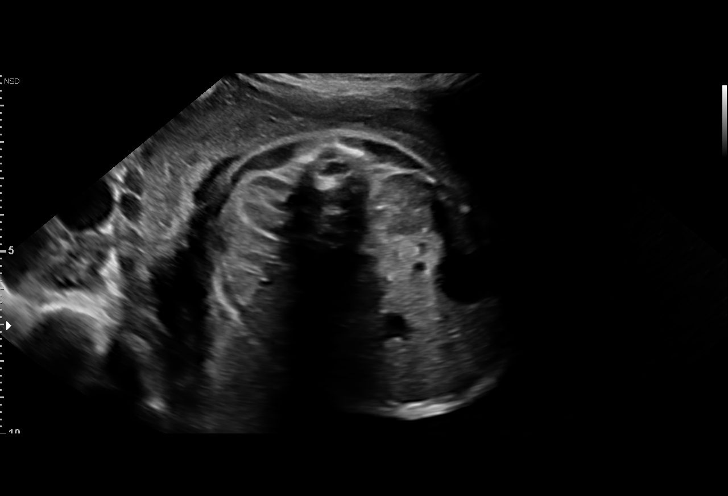
[im 17/24]
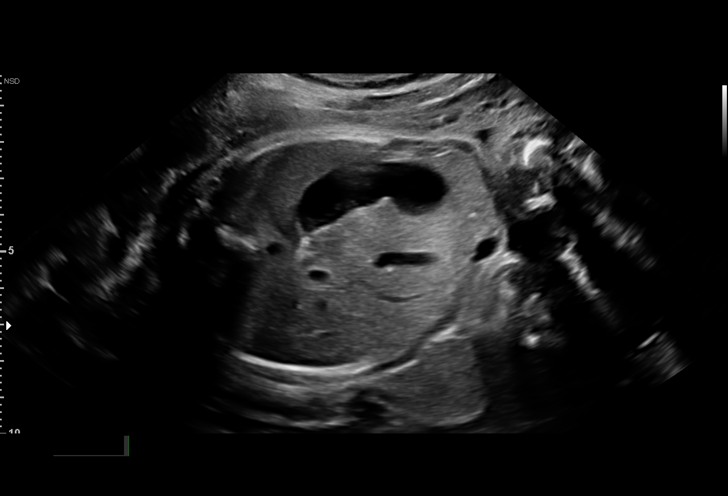
[im 19/24]
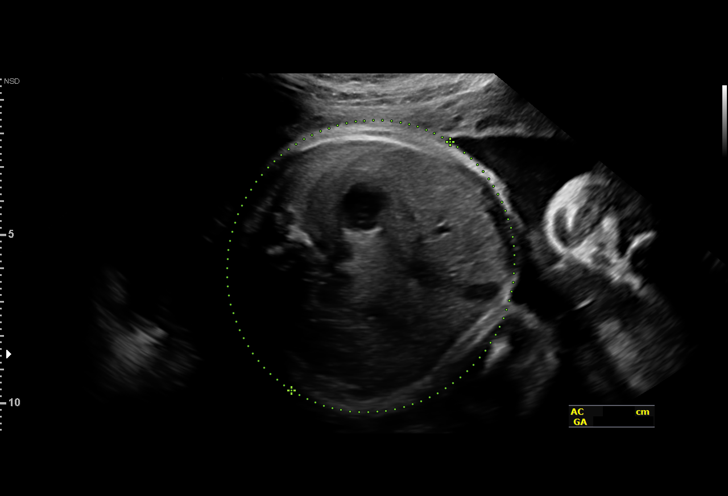
[im 20/24]
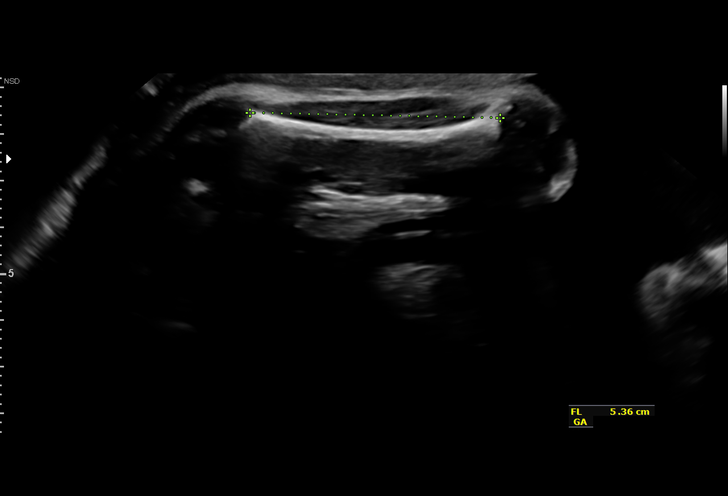
[im 22/24]
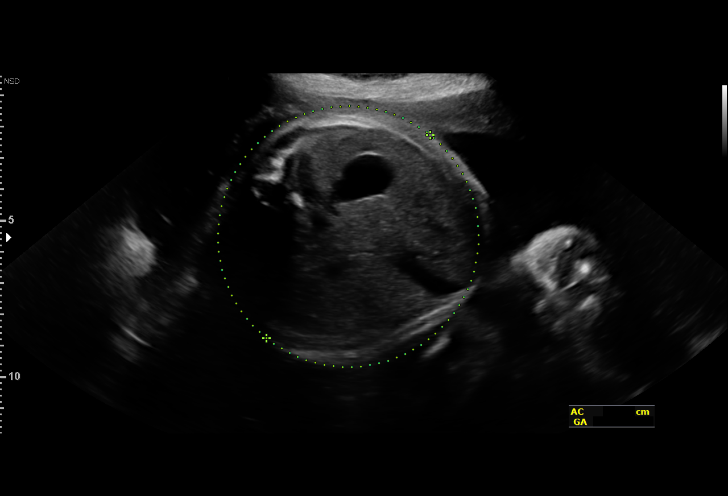
[im 24/24]
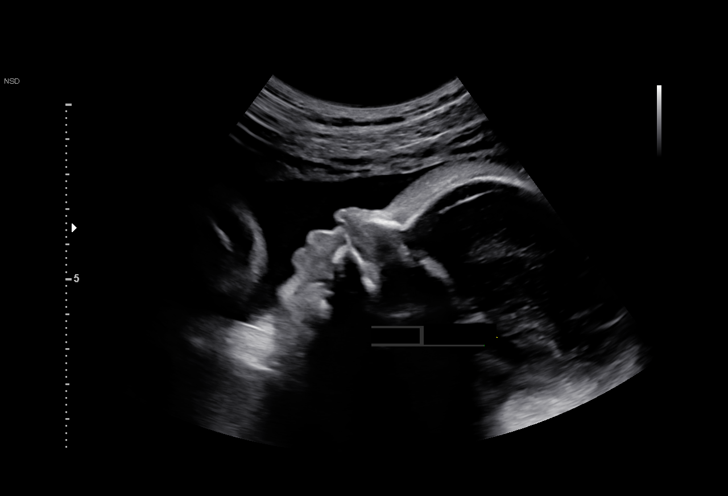

[14 of 24 positions shown; findings below may reference images not displayed]

OB/Gyn Clinic

Indications

29 weeks gestation of pregnancy
Late prenatal care, second trimester
Encounter for other antenatal screening
follow-up
OB History

Blood Type:            Height:  5'1"   Weight (lb):  124       BMI:
Gravidity:    1
Fetal Evaluation

Num Of Fetuses:     1
Fetal Heart         150
Rate(bpm):
Cardiac Activity:   Observed
Presentation:       Cephalic
Placenta:           Posterior, above cervical os
P. Cord Insertion:  Previously Visualized

Amniotic Fluid
AFI FV:      Subjectively within normal limits

AFI Sum(cm)     %Tile       Largest Pocket(cm)
15.07           53

RUQ(cm)       RLQ(cm)       LUQ(cm)        LLQ(cm)
3.1
Biometry

BPD:      76.5  mm     G. Age:  30w 5d         65  %    CI:        74.51   %    70 - 86
FL/HC:      19.3   %    19.2 -
HC:      281.3  mm     G. Age:  30w 6d         43  %    HC/AC:      1.06        0.99 -
AC:      265.6  mm     G. Age:  30w 5d         69  %    FL/BPD:     71.0   %    71 - 87
FL:       54.3  mm     G. Age:  28w 5d         10  %    FL/AC:      20.4   %    20 - 24

Est. FW:    3501  gm      3 lb 5 oz     57  %
Gestational Age

LMP:           29w 6d        Date:  02/07/17                 EDD:   11/14/17
U/S Today:     30w 2d                                        EDD:   11/11/17
Best:          29w 6d     Det. By:  LMP  (02/07/17)          EDD:   11/14/17
Anatomy

Cranium:               Appears normal         Aortic Arch:            Previously seen
Cavum:                 Previously seen        Ductal Arch:            Previously seen
Ventricles:            Appears normal         Diaphragm:              Appears normal
Choroid Plexus:        Previously seen        Stomach:                Appears normal, left
sided
Cerebellum:            Previously seen        Abdomen:                Appears normal
Posterior Fossa:       Previously seen        Abdominal Wall:         Previously seen
Nuchal Fold:           Previously seen        Cord Vessels:           Previously seen
Face:                  Orbits and profile     Kidneys:                Appear normal
previously seen
Lips:                  Previously seen        Bladder:                Appears normal
Thoracic:              Appears normal         Spine:                  Previously seen
Heart:                 Appears normal         Upper Extremities:      Previously seen
(4CH, axis, and situs
RVOT:                  Not well visualized    Lower Extremities:      Previously seen
LVOT:                  Previously seen

Other:  Female gender previously seen. Heels and 5th digit prev visualized.
Nasal bone prev visualized.
Cervix Uterus Adnexa

Cervix
Not visualized (advanced GA >77wks)

Left Ovary
Previously seen.

Right Ovary
Previously seen
Comments

The fetal anatomy remains incomplietely visualized after
several ultrasounds. It is unlikely that further attempts will
allow complete visualization of the fetal anatomy. However, if
completion is desired an additional follow up ultrasound
should be performed in 4 weeks.
Impression

Single living intrauterine pregnancy at 29 weeks 6 days.
Appropriate fetal growth (57%).
Normal amniotic fluid volume.
The fetal anatomic survey remains incomplete--the RVOT
has not been adequately visualized.
No gross fetal anomalies identified.
Recommendations

Follow-up ultrasounds as clinically indicated.

## 2018-12-08 IMAGING — US US MFM FETAL BPP W/O NON-STRESS
1 series · 9 of 9 positions shown · non-contrast
Comparison: none

[Series 1: us mfm fetal bpp w/o non-stress · 9 acquisitions, 9 frames shown]
[im 1/9]
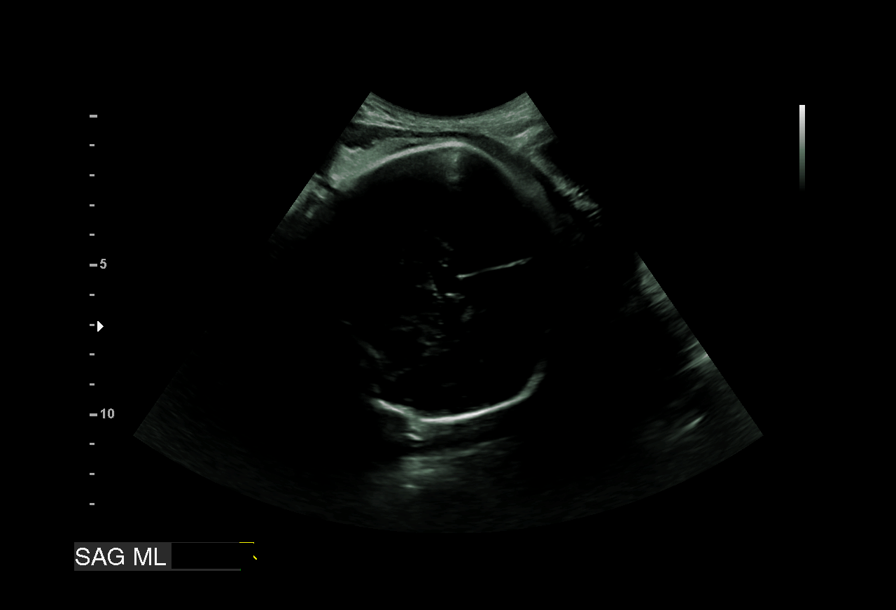
[im 2/9]
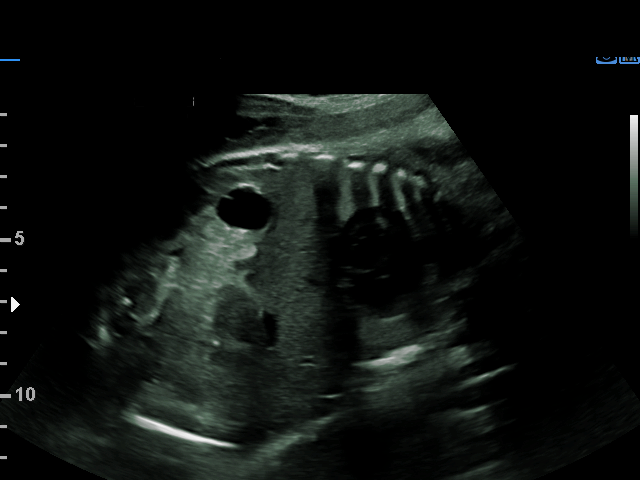
[im 3/9]
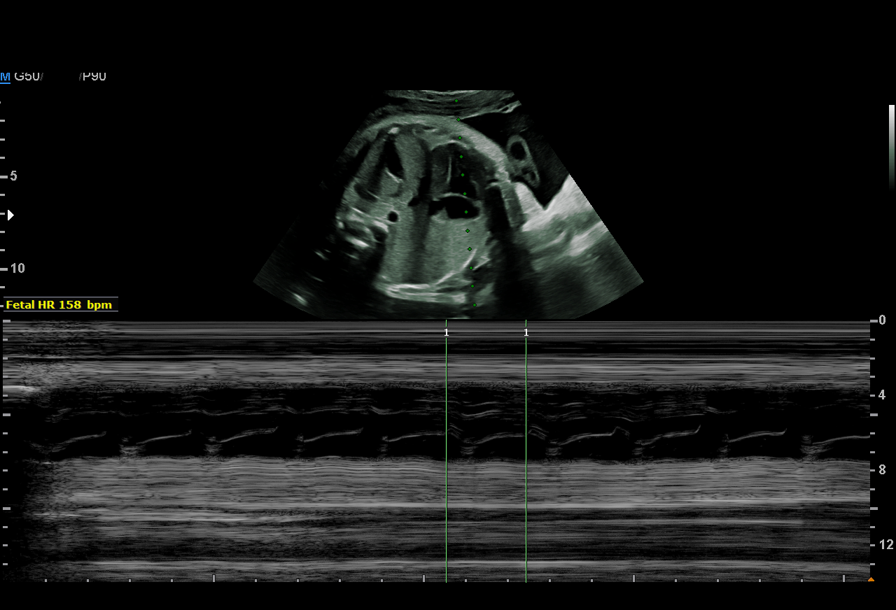
[im 4/9]
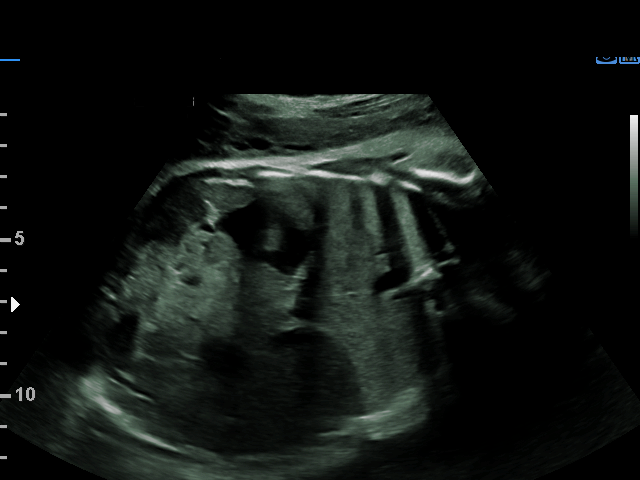
[im 5/9]
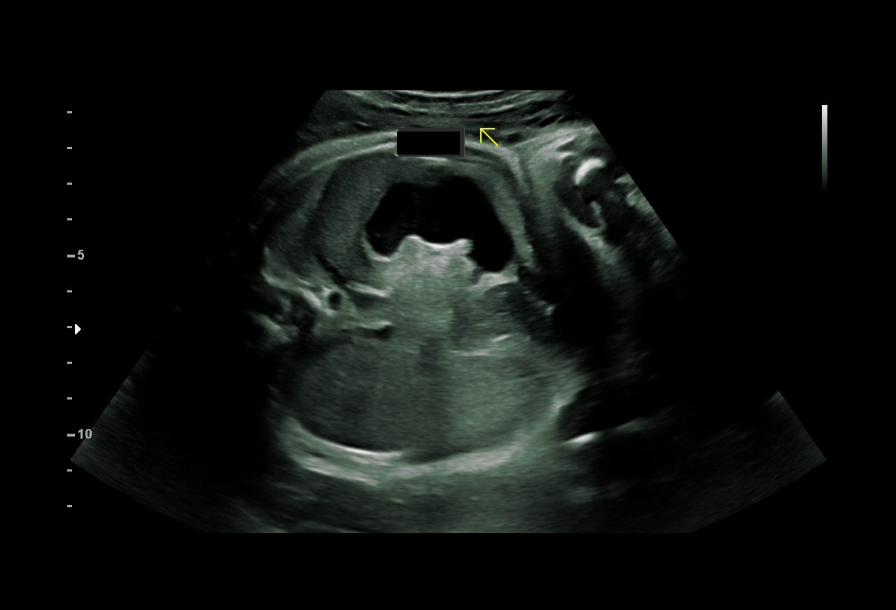
[im 6/9]
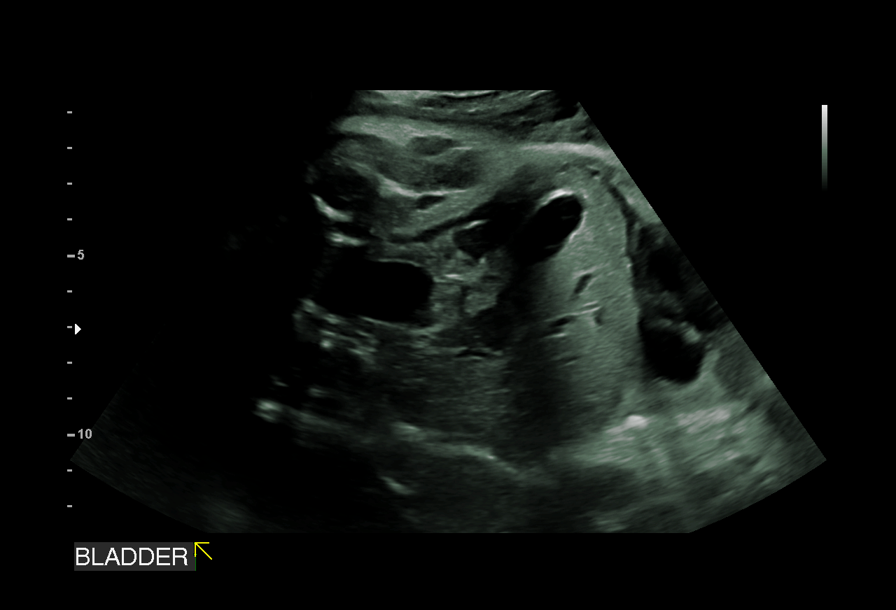
[im 7/9]
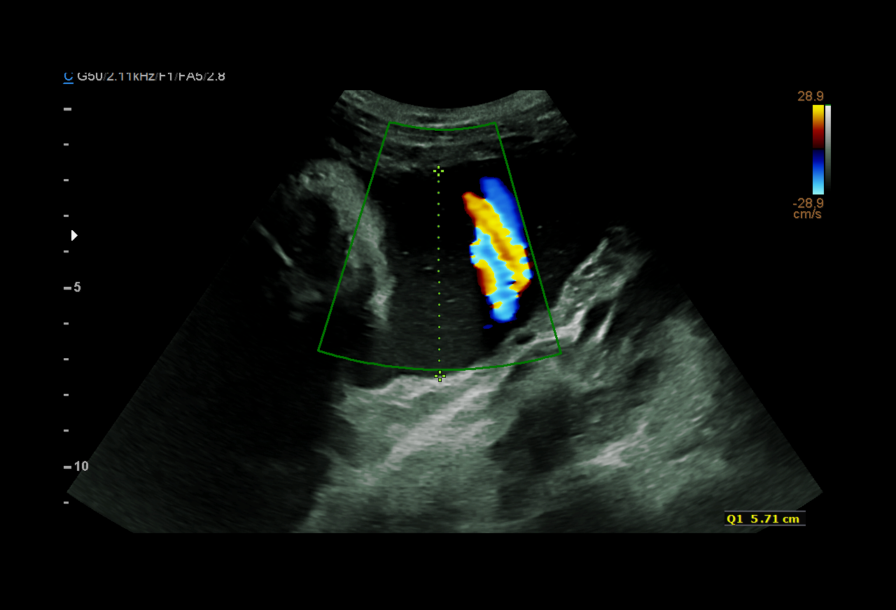
[im 8/9]
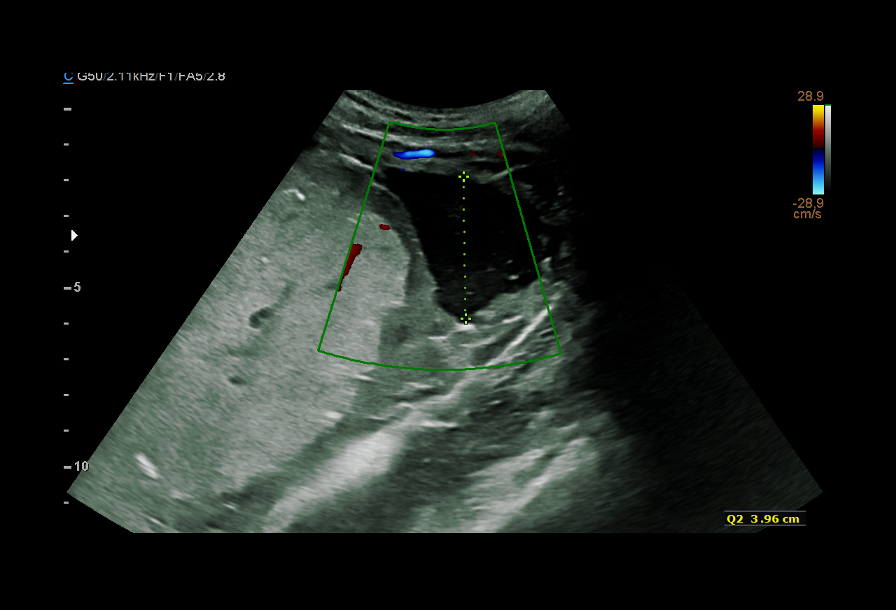
[im 9/9]
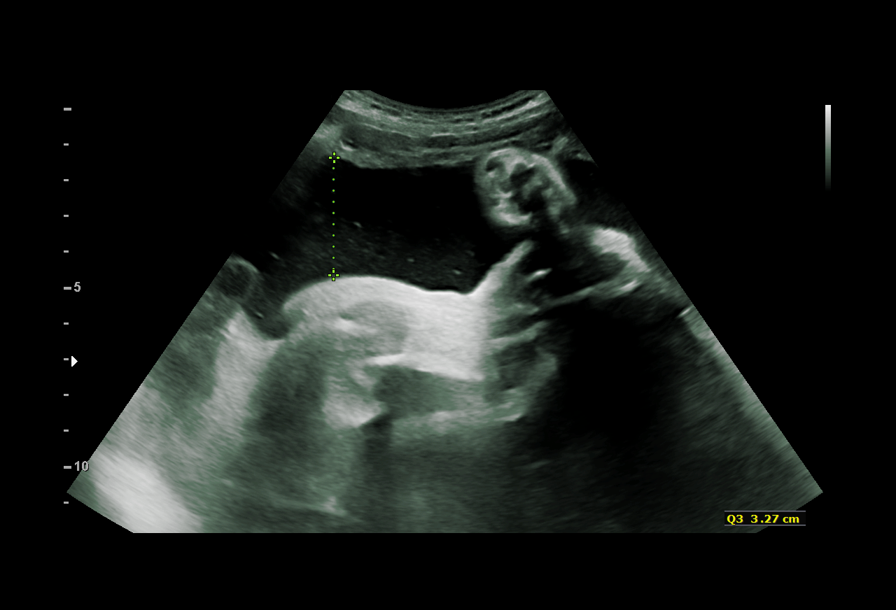

[9 of 9 positions shown; findings below may reference images not displayed]

OB/Gyn Clinic
Attending:        Jardi Elj         Secondary Phy.:   [facility]-
MAU/Triage

Indications

34 weeks gestation of pregnancy
Abdominal pain in pregnancy
Variable fetal heart rate decelerations,
antepartum
OB History

Blood Type:            Height:  5'1"   Weight (lb):  124       BMI:
Gravidity:    1
Fetal Evaluation

Num Of Fetuses:     1
Fetal Heart         158
Rate(bpm):
Cardiac Activity:   Observed
Presentation:       Cephalic

Amniotic Fluid
AFI FV:      Subjectively within normal limits

AFI Sum(cm)     %Tile       Largest Pocket(cm)
12.94           42

RUQ(cm)                     LUQ(cm)        LLQ(cm)
5.71
Biophysical Evaluation

Amniotic F.V:   Within normal limits       F. Tone:        Observed
F. Movement:    Observed                   Score:          [DATE]
F. Breathing:   Observed
Gestational Age

LMP:           34w 5d        Date:  02/07/17                 EDD:   11/14/17
Best:          34w 5d     Det. By:  LMP  (02/07/17)          EDD:   11/14/17
Anatomy

Stomach:               Appears normal, left   Bladder:                Appears normal
sided
Impression

Singleton intrauterine pregnancy a 34 weeks 5 days gestation
with fetal cardiac activity
Cephalic presentation
BPP [DATE] with an AFI > 12 cm
Recommendations

A BPP of [DATE] cannot be considered reassuring in the
presence of recurrent variable or late decelerations as to long
term fetal well-being
Follow-up ultrasounds as clinically indicated.

## 2019-04-08 ENCOUNTER — Emergency Department (HOSPITAL_COMMUNITY)
Admission: EM | Admit: 2019-04-08 | Discharge: 2019-04-08 | Disposition: A | Discharge status: Home / Self Care | Payer: Medicaid Other | Attending: Emergency Medicine | Admitting: Emergency Medicine

## 2019-04-08 ENCOUNTER — Other Ambulatory Visit: Payer: Self-pay

## 2019-04-08 ENCOUNTER — Encounter (HOSPITAL_COMMUNITY): Payer: Self-pay | Admitting: Emergency Medicine

## 2019-04-08 DIAGNOSIS — J029 Acute pharyngitis, unspecified: Secondary | ICD-10-CM | POA: Insufficient documentation

## 2019-04-08 DIAGNOSIS — Z20828 Contact with and (suspected) exposure to other viral communicable diseases: Secondary | ICD-10-CM | POA: Diagnosis not present

## 2019-04-08 DIAGNOSIS — R509 Fever, unspecified: Secondary | ICD-10-CM | POA: Diagnosis not present

## 2019-04-08 DIAGNOSIS — Z87891 Personal history of nicotine dependence: Secondary | ICD-10-CM | POA: Diagnosis not present

## 2019-04-08 LAB — GROUP A STREP BY PCR: Group A Strep by PCR: NOT DETECTED

## 2019-04-08 MED ORDER — IBUPROFEN 800 MG PO TABS: 800.0000 mg | ORAL_TABLET | Freq: Three times a day (TID) | ORAL | 0 refills | Status: DC | PRN

## 2019-04-08 MED ORDER — DEXAMETHASONE SODIUM PHOSPHATE 10 MG/ML IJ SOLN
10.0000 mg | Freq: Once | INTRAMUSCULAR | Status: AC
  Administered 2019-04-08: 10 mg via INTRAMUSCULAR
  Filled 2019-04-08: qty 1

## 2019-04-08 NOTE — Discharge Instructions (Signed)
You were seen in the emergency department today with sore throat.  Your strep test was negative.  I am testing you for COVID-19.  Please stay home from work until your test results come back negative and you have felt well for at least 3 days without fever. Take Motrin and Tylenol as needed for pain. Return to the ED with any new or suddenly worsening symptoms.

## 2019-04-08 NOTE — ED Notes (Signed)
Patient verbalizes understanding of discharge instructions . Opportunity for questions and answers were provided . Armband removed by staff ,Pt discharged from ED. W/C  offered at D/C  and Declined W/C at D/C and was escorted to lobby by RN.  

## 2019-04-08 NOTE — ED Provider Notes (Signed)
Emergency Department Provider Note   I have reviewed the triage vital signs and the nursing notes.   HISTORY  Chief Complaint Sore Throat   HPI Anita Love is a 23 y.o. female with PMH reviewed below presents to the ED for evaluation of sore throat with mild cough and subjective fever for the past 3 days.  Patient describes initial "itchy" sensation in her throat but has since become more painful to swallow.  She has no difficulty swallowing.  No voice changes or shortness of breath.  She has noticed some redness/swelling in the back of the throat.  She states that last night she was feeling very hot and somewhat sweaty.  She did not record any temperature but felt as if she might have a fever.  No shaking chills.  No known COVID-19 contacts although patient does work in Personnel officer with the public.    Past Medical History:  Diagnosis Date  . Headache    no meds   . Medical history non-contributory     Patient Active Problem List   Diagnosis Date Noted  . Intrauterine infection during labor 11/14/2017  . S/P cesarean section for FTP 11/13/2017  . Encounter for supervision of normal pregnancy 07/12/2017  . High risk teen pregnancy 07/12/2017    Past Surgical History:  Procedure Laterality Date  . CESAREAN SECTION N/A 11/14/2017   Procedure: CESAREAN SECTION;  Surgeon: Tereso Newcomer, MD;  [facility];  Service: Obstetrics;  Laterality: N/A;  . NO PAST SURGERIES      Allergies Patient has no known allergies.  Family History  Problem Relation Age of Onset  . ADD / ADHD Neg Hx   . Anxiety disorder Neg Hx   . Cancer Neg Hx   . Hypertension Neg Hx   . Miscarriages / Stillbirths Neg Hx   . Vision loss Neg Hx   . Varicose Veins Neg Hx   . Stroke Neg Hx   . Obesity Neg Hx   . Learning disabilities Neg Hx   . Kidney disease Neg Hx   . Intellectual disability Neg Hx   . Hyperlipidemia Neg Hx   . Heart disease Neg Hx   . Early death Neg Hx   .  Hearing loss Neg Hx   . Drug abuse Neg Hx   . Diabetes Neg Hx   . Depression Neg Hx   . COPD Neg Hx   . Birth defects Neg Hx   . Asthma Neg Hx   . Arthritis Neg Hx   . Alcohol abuse Neg Hx     Social History Social History   Tobacco Use  . Smoking status: Former Games developer  . Smokeless tobacco: Never Used  Substance Use Topics  . Alcohol use: No  . Drug use: No    Review of Systems  Constitutional: Positive subjective fever. No chills.  Eyes: No visual changes. ENT: Positive sore throat. Cardiovascular: Denies chest pain. Respiratory: Denies shortness of breath. Mild cough.  Gastrointestinal: No abdominal pain.  No nausea, no vomiting.  No diarrhea.  No constipation. Genitourinary: Negative for dysuria. Musculoskeletal: Negative for back pain. Skin: Negative for rash. Neurological: Negative for headaches, focal weakness or numbness.  10-point ROS otherwise negative.  ____________________________________________   PHYSICAL EXAM:  VITAL SIGNS: ED Triage Vitals  Enc Vitals Group     BP 04/08/19 0924 108/70     Pulse Rate 04/08/19 0924 95     Resp 04/08/19 0924 20  Temp 04/08/19 0924 99.2 F (37.3 C)     Temp Source 04/08/19 0924 Oral     SpO2 04/08/19 0924 100 %     Weight 04/08/19 0928 110 lb (49.9 kg)     Height 04/08/19 0928 5\' 1"  (1.549 m)   Constitutional: Alert and oriented. Well appearing and in no acute distress. Eyes: Conjunctivae are normal.  Head: Atraumatic. Nose: No congestion/rhinnorhea. Mouth/Throat: Mucous membranes are moist.  Oropharynx with posterior erythema with scant exudate. No PTA. No trismus. Clear voice.  Neck: No stridor.  Cardiovascular: Normal rate, regular rhythm. Good peripheral circulation. Grossly normal heart sounds.   Respiratory: Normal respiratory effort.  No retractions. Lungs CTAB. Gastrointestinal: No distention.  Musculoskeletal: No gross deformities of extremities. Neurologic:  Normal speech and language.  Skin:   Skin is warm, dry and intact. No rash noted.  ____________________________________________   LABS (all labs ordered are listed, but only abnormal results are displayed)  Labs Reviewed  GROUP A STREP BY PCR  NOVEL CORONAVIRUS, NAA (HOSP ORDER, SEND-OUT TO REF LAB; TAT 18-24 HRS)   ____________________________________________   PROCEDURES  Procedure(s) performed:   Procedures  None  ____________________________________________   INITIAL IMPRESSION / ASSESSMENT AND PLAN / ED COURSE  Pertinent labs & imaging results that were available during my care of the patient were reviewed by me and considered in my medical decision making (see chart for details).   Patient presents to the emergency department for evaluation of sore throat over the past several days.  She has had subjective fever with mild cough.  Posterior pharynx is erythematous with scant exudate.  I am sending strep PCR.  If negative, plan for steroid and COVID-19 testing along with home isolation guidelines.   Strep negative. Added COVID testing and advised to quarantine. Patient to follow results in [place].   Anita Love was evaluated in Emergency Department on 04/08/2019 for the symptoms described in the history of present illness. She was evaluated in the context of the global COVID-19 pandemic, which necessitated consideration that the patient might be at risk for infection with the SARS-CoV-2 virus that causes COVID-19. Institutional protocols and algorithms that pertain to the evaluation of patients at risk for COVID-19 are in a state of rapid change based on information released by regulatory bodies including the CDC and federal and state organizations. These policies and algorithms were followed during the patient's care in the ED.  ____________________________________________  FINAL CLINICAL IMPRESSION(S) / ED DIAGNOSES  Final diagnoses:  Viral pharyngitis     MEDICATIONS GIVEN DURING THIS VISIT:   Medications  dexamethasone (DECADRON) injection 10 mg (10 mg Intramuscular Given 04/08/19 1148)    Note:  This document was prepared using Dragon voice recognition software and may include unintentional dictation errors.  Nanda Quinton, MD, [facility]    Long, Wonda Olds, MD 04/08/19 [phone]

## 2019-04-08 NOTE — ED Triage Notes (Signed)
Pt c/o sore throat, for past couple days-- throat red/swollen-- painful to swallow-- no covid exposure--

## 2019-04-09 LAB — NOVEL CORONAVIRUS, NAA (HOSP ORDER, SEND-OUT TO REF LAB; TAT 18-24 HRS): SARS-CoV-2, NAA: NOT DETECTED

## 2019-04-17 ENCOUNTER — Emergency Department (HOSPITAL_COMMUNITY)
Admission: EM | Admit: 2019-04-17 | Discharge: 2019-04-18 | Disposition: A | Discharge status: Other Acute Inpatient Hospital | Payer: Medicaid Other | Attending: Emergency Medicine | Admitting: Emergency Medicine

## 2019-04-17 ENCOUNTER — Other Ambulatory Visit: Payer: Self-pay

## 2019-04-17 DIAGNOSIS — T39312A Poisoning by propionic acid derivatives, intentional self-harm, initial encounter: Secondary | ICD-10-CM | POA: Insufficient documentation

## 2019-04-17 DIAGNOSIS — Z20828 Contact with and (suspected) exposure to other viral communicable diseases: Secondary | ICD-10-CM | POA: Insufficient documentation

## 2019-04-17 DIAGNOSIS — Z87891 Personal history of nicotine dependence: Secondary | ICD-10-CM | POA: Diagnosis not present

## 2019-04-17 DIAGNOSIS — T50902A Poisoning by unspecified drugs, medicaments and biological substances, intentional self-harm, initial encounter: Secondary | ICD-10-CM

## 2019-04-17 DIAGNOSIS — F329 Major depressive disorder, single episode, unspecified: Secondary | ICD-10-CM | POA: Insufficient documentation

## 2019-04-17 LAB — CBC WITH DIFFERENTIAL/PLATELET
Abs Immature Granulocytes: 0.01 10*3/uL (ref 0.00–0.07)
Basophils Absolute: 0 10*3/uL (ref 0.0–0.1)
Basophils Relative: 1 %
Eosinophils Absolute: 0 10*3/uL (ref 0.0–0.5)
Eosinophils Relative: 0 %
HCT: 41.7 % (ref 36.0–46.0)
Hemoglobin: 13.6 g/dL (ref 12.0–15.0)
Immature Granulocytes: 0 %
Lymphocytes Relative: 34 %
Lymphs Abs: 1.9 10*3/uL (ref 0.7–4.0)
MCH: 29.6 pg (ref 26.0–34.0)
MCHC: 32.6 g/dL (ref 30.0–36.0)
MCV: 90.8 fL (ref 80.0–100.0)
Monocytes Absolute: 0.5 10*3/uL (ref 0.1–1.0)
Monocytes Relative: 10 %
Neutro Abs: 3.1 10*3/uL (ref 1.7–7.7)
Neutrophils Relative %: 55 %
Platelets: 253 10*3/uL (ref 150–400)
RBC: 4.59 MIL/uL (ref 3.87–5.11)
RDW: 12.8 % (ref 11.5–15.5)
WBC: 5.5 10*3/uL (ref 4.0–10.5)
nRBC: 0 % (ref 0.0–0.2)

## 2019-04-17 LAB — ACETAMINOPHEN LEVEL
Acetaminophen (Tylenol), Serum: <10 ug/mL — ABNORMAL LOW (ref 10–30)
Acetaminophen (Tylenol), Serum: <10 ug/mL — ABNORMAL LOW (ref 10–30)

## 2019-04-17 LAB — COMPREHENSIVE METABOLIC PANEL
ALT: 11 U/L (ref 0–44)
AST: 15 U/L (ref 15–41)
Albumin: 4.4 g/dL (ref 3.5–5.0)
Alkaline Phosphatase: 56 U/L (ref 38–126)
Anion gap: 10 (ref 5–15)
BUN: 11 mg/dL (ref 6–20)
CO2: 24 mmol/L (ref 22–32)
Calcium: 9.8 mg/dL (ref 8.9–10.3)
Chloride: 106 mmol/L (ref 98–111)
Creatinine, Ser: 0.75 mg/dL (ref 0.44–1.00)
GFR calc Af Amer: >60 mL/min (ref >60)
GFR calc non Af Amer: >60 mL/min (ref >60)
Glucose, Bld: 99 mg/dL (ref 70–99)
Potassium: 3.7 mmol/L (ref 3.5–5.1)
Sodium: 140 mmol/L (ref 135–145)
Total Bilirubin: 0.8 mg/dL (ref 0.3–1.2)
Total Protein: 8.2 g/dL — ABNORMAL HIGH (ref 6.5–8.1)

## 2019-04-17 LAB — SALICYLATE LEVEL: Salicylate Lvl: <7 mg/dL (ref 2.8–30.0)

## 2019-04-17 LAB — RAPID URINE DRUG SCREEN, HOSP PERFORMED
Amphetamines: NOT DETECTED
Barbiturates: NOT DETECTED
Benzodiazepines: NOT DETECTED
Cocaine: NOT DETECTED
Opiates: NOT DETECTED
Tetrahydrocannabinol: POSITIVE — AB

## 2019-04-17 LAB — ETHANOL: Alcohol, Ethyl (B): <10 mg/dL (ref <10)

## 2019-04-17 MED ORDER — SODIUM CHLORIDE 0.9 % IV BOLUS
1000.0000 mL | Freq: Once | INTRAVENOUS | Status: AC
  Administered 2019-04-17: 1000 mL via INTRAVENOUS

## 2019-04-17 NOTE — BH Assessment (Addendum)
Tele Assessment Note   Patient Name: Anita Love MRN: [id number] Referring Physician: Ward, Ozella Almond, PA-C Location of Patient: [facility] Location of Provider: Behavioral Health TTS Department  Anita Love is an 23 y.o. female who presents to the ED voluntarily. Pt reports she intentionally ingested 8 of her 400 mg ibuprofen pills after hearing bad news. Pt states she felt sad that she contracted an STD. Pt states her doctor called her this morning to tell her and she felt overwhelmed, shocked, and hurt. Pt states she could not stop crying and continued to cry uncontrollably. Pt reports she is not suicidal and states she took the medication in order to stop feeling any emotions. Pt states she has a 106 year old daughter that she loves and would never leave. Pt denies SI, HI, or AVH. Pt states she feels that the initial shock is what caused her to take the medication. Pt states she told her younger sister, who lives with her, that she took the medication, and her sister told their mother who then called EMS who brought her to the ED. Pt denies any previous MH tx. Pt states she had a party yesterday with friends and "felt fine." Pt states she is able to contract for safety and states she wants to be d/c to return home to her daughter. TTS asked pt for permission to speak with her supports for collateral information and pt declined to provide consent stating "they won't be able to tell you anything, I feel fine." Pt is smiling and pleasant throughout the assessment.  TTS consulted with Lindon Romp, NP who recommends inpt tx and IVC if pt does not agree to sign VOL consent for tx. EDP [place], [place] has been advised.   Diagnosis: MDD, single episode, w/o psychosis  Past Medical History:  Past Medical History:  Diagnosis Date  . Headache    no meds   . Medical history non-contributory     Past Surgical History:  Procedure Laterality Date  . CESAREAN SECTION N/A 11/14/2017   Procedure: CESAREAN  SECTION;  Surgeon: Osborne Oman, MD;  Location: [place];  Service: Obstetrics;  Laterality: N/A;  . NO PAST SURGERIES      Family History:  Family History  Problem Relation Age of Onset  . ADD / ADHD Neg Hx   . Anxiety disorder Neg Hx   . Cancer Neg Hx   . Hypertension Neg Hx   . Miscarriages / Stillbirths Neg Hx   . Vision loss Neg Hx   . Varicose Veins Neg Hx   . Stroke Neg Hx   . Obesity Neg Hx   . Learning disabilities Neg Hx   . Kidney disease Neg Hx   . Intellectual disability Neg Hx   . Hyperlipidemia Neg Hx   . Heart disease Neg Hx   . Early death Neg Hx   . Hearing loss Neg Hx   . Drug abuse Neg Hx   . Diabetes Neg Hx   . Depression Neg Hx   . COPD Neg Hx   . Birth defects Neg Hx   . Asthma Neg Hx   . Arthritis Neg Hx   . Alcohol abuse Neg Hx     Social History:  reports that she has quit smoking. She has never used smokeless tobacco. She reports that she does not drink alcohol or use drugs.  Additional Social History:  Alcohol / Drug Use Pain Medications: See MAR Prescriptions: See MAR Over the Counter: See [facility]  History of alcohol / drug use?: Yes Substance #1 Name of Substance 1: Cannabis 1 - Age of First Use: adolescent 1 - Amount (size/oz): varies 1 - Frequency: socially 1 - Duration: ongoing 1 - Last Use / Amount: 04/16/19  CIWA: CIWA-Ar BP: 107/71 Pulse Rate: 80 COWS:    Allergies: No Known Allergies  Home Medications: (Not in a hospital admission)   OB/GYN Status:  Patient's last menstrual period was 04/06/2019.  General Assessment Data Assessment unable to be completed: Yes Reason for not completing assessment: TTS spoke with Marchelle Folks, states ED staff are in Covid + room and cannot access cart at this time. States will call back when cart is ready. Location of Assessment: [facility] TTS Assessment: In system Is this a Tele or Face-to-Face Assessment?: Tele Assessment Is this an Initial Assessment or a Re-assessment for this  encounter?: Initial Assessment Patient Accompanied by:: N/A Language Other than English: No Living Arrangements: Other (Comment) What gender do you identify as?: Female Marital status: Single Pregnancy Status: No Living Arrangements: Other relatives Can pt return to current living arrangement?: Yes Admission Status: Voluntary Is patient capable of signing voluntary admission?: Yes Referral Source: Self/Family/Friend Insurance type: MCD     Crisis Care Plan Living Arrangements: Other relatives Name of Psychiatrist: none Name of Therapist: none  Education Status Is patient currently in school?: No Is the patient employed, unemployed or receiving disability?: Employed  Risk to self with the past 6 months Suicidal Ideation: No-Not Currently/Within Last 6 Months Has patient been a risk to self within the past 6 months prior to admission? : Yes Suicidal Intent: No Has patient had any suicidal intent within the past 6 months prior to admission? : No Is patient at risk for suicide?: Yes Suicidal Plan?: No Has patient had any suicidal plan within the past 6 months prior to admission? : No Access to Means: No What has been your use of drugs/alcohol within the last 12 months?: occasional cannabis Previous Attempts/Gestures: No Other Self Harm Risks: impulsive Triggers for Past Attempts: None known Intentional Self Injurious Behavior: None Family Suicide History: No Recent stressful life event(s): Recent negative physical changes, Turmoil (Comment)(recently dx with STD) Persecutory voices/beliefs?: No Depression: Yes Depression Symptoms: Feeling angry/irritable, Tearfulness, Fatigue, Despondent Substance abuse history and/or treatment for substance abuse?: No Suicide prevention information given to non-admitted patients: Not applicable  Risk to Others within the past 6 months Homicidal Ideation: No Does patient have any lifetime risk of violence toward others beyond the six  months prior to admission? : No Thoughts of Harm to Others: No Current Homicidal Intent: No Current Homicidal Plan: No Access to Homicidal Means: No History of harm to others?: No Assessment of Violence: None Noted Does patient have access to weapons?: No Criminal Charges Pending?: No Does patient have a court date: No Is patient on probation?: No  Psychosis Hallucinations: None noted Delusions: None noted  Mental Status Report Appearance/Hygiene: Unremarkable Eye Contact: Good Motor Activity: Freedom of movement Speech: Logical/coherent Level of Consciousness: Alert Mood: Depressed Affect: Sad Anxiety Level: None Thought Processes: Coherent, Relevant Judgement: Impaired Orientation: Person, Place, Time, Situation, Appropriate for developmental age Obsessive Compulsive Thoughts/Behaviors: None  Cognitive Functioning Concentration: Normal Memory: Remote Intact, Recent Intact Is patient IDD: No Insight: Poor Impulse Control: Poor Appetite: Good Have you had any weight changes? : No Change Sleep: Increased Total Hours of Sleep: 10 Vegetative Symptoms: None  ADLScreening [facility] Assessment Services) Patient's cognitive ability adequate to safely complete daily activities?: Yes Patient able  to express need for assistance with ADLs?: Yes Independently performs ADLs?: Yes (appropriate for developmental age)  Prior Inpatient Therapy Prior Inpatient Therapy: No  Prior Outpatient Therapy Prior Outpatient Therapy: No Does patient have an ACCT team?: No Does patient have Intensive In-House Services?  : No Does patient have [facility] services? : No Does patient have P4CC services?: No  ADL Screening (condition at time of admission) Patient's cognitive ability adequate to safely complete daily activities?: Yes Is the patient deaf or have difficulty hearing?: No Does the patient have difficulty seeing, even when wearing glasses/contacts?: No Does the patient have difficulty  concentrating, remembering, or making decisions?: No Patient able to express need for assistance with ADLs?: Yes Does the patient have difficulty dressing or bathing?: No Independently performs ADLs?: Yes (appropriate for developmental age) Does the patient have difficulty walking or climbing stairs?: No Weakness of Legs: None Weakness of Arms/Hands: None  Home Assistive Devices/Equipment Home Assistive Devices/Equipment: None    Abuse/Neglect Assessment (Assessment to be complete while patient is alone) Abuse/Neglect Assessment Can Be Completed: Yes Physical Abuse: Denies Verbal Abuse: Denies Sexual Abuse: Denies Exploitation of patient/patient's resources: Denies Self-Neglect: Denies     Merchant navy officerAdvance Directives (For Healthcare) Does Patient Have a Medical Advance Directive?: No Would patient like information on creating a medical advance directive?: No - Patient declined          Disposition: TTS consulted with Nira ConnJason Berry, NP who recommends inpt tx and IVC if pt does not agree to sign VOL consent for tx. EDP [place], [place] has been advised.   Disposition Initial Assessment Completed for this Encounter: Yes Disposition of Patient: Admit Type of inpatient treatment program: Adult Patient refused recommended treatment: No  This service was provided via telemedicine using a 2-way, interactive audio and video technology.  Names of all persons participating in this telemedicine service and their role in this encounter. Name:  [place] Love Role: Patient  Name: Anita Love Role: TTS          Anita Love 04/17/2019 10:11 PM

## 2019-04-17 NOTE — ED Provider Notes (Signed)
[facility]-EMERGENCY DEPT Provider Note   CSN: [id number] Arrival date & time: 04/17/19  1631     History   Chief Complaint Chief Complaint  Patient presents with  . Drug Overdose    HPI Anita Love is a 23 y.o. female.     The history is provided by the patient and medical records. No language interpreter was used.  Drug Overdose   Anita Love is a 23 y.o. female  with a PMH as listed below who presents to the Emergency Department for evaluation after overdose on ibuprofen just prior to arrival.  Patient states that she got a phone call from her doctor telling her that she has herpes after her annual women's exam.  She was told to pick up medication at the pharmacy for this.  She states that she felt very hurt overwhelmed.  She could not stop crying.  She took about 6-8 of her prescription strength ibuprofen in an attempt to " just numb all the pain".  She states that she does not want to kill herself, she just wanted to stop hurting.  She states there is no physical pain, just emotional pain.  Denies EtOH or illicit drug use.  Denies any other coingestants.  No complaints at this time.  Past Medical History:  Diagnosis Date  . Headache    no meds   . Medical history non-contributory     Patient Active Problem List   Diagnosis Date Noted  . Intrauterine infection during labor 11/14/2017  . S/P cesarean section for FTP 11/13/2017  . Encounter for supervision of normal pregnancy 07/12/2017  . High risk teen pregnancy 07/12/2017    Past Surgical History:  Procedure Laterality Date  . CESAREAN SECTION N/A 11/14/2017   Procedure: CESAREAN SECTION;  Surgeon: Tereso Newcomer, MD;  Location: [facility];  Service: Obstetrics;  Laterality: N/A;  . NO PAST SURGERIES       OB History    Gravida  1   Para  1   Term  1   Preterm      AB      Living  1     SAB      TAB      Ectopic      Multiple  0   Live Births  1           Home Medications    Prior to Admission medications   Medication Sig Start Date End Date Taking? Authorizing Provider  ibuprofen (ADVIL) 800 MG tablet Take 1 tablet (800 mg total) by mouth every 8 (eight) hours as needed for moderate pain. 04/08/19   Long, Arlyss Repress, MD  Prenatal Vit-Fe Fumarate-FA (PRENATAL MULTIVITAMIN) TABS tablet Take 1 tablet by mouth daily at 12 noon.    [provider]  senna-docusate (SENOKOT-S) 8.6-50 MG tablet Take 2 tablets by mouth daily. 11/17/17   Pincus Large, DO    Family History Family History  Problem Relation Age of Onset  . ADD / ADHD Neg Hx   . Anxiety disorder Neg Hx   . Cancer Neg Hx   . Hypertension Neg Hx   . Miscarriages / Stillbirths Neg Hx   . Vision loss Neg Hx   . Varicose Veins Neg Hx   . Stroke Neg Hx   . Obesity Neg Hx   . Learning disabilities Neg Hx   . Kidney disease Neg Hx   . Intellectual disability Neg Hx   . Hyperlipidemia Neg  Hx   . Heart disease Neg Hx   . Early death Neg Hx   . Hearing loss Neg Hx   . Drug abuse Neg Hx   . Diabetes Neg Hx   . Depression Neg Hx   . COPD Neg Hx   . Birth defects Neg Hx   . Asthma Neg Hx   . Arthritis Neg Hx   . Alcohol abuse Neg Hx     Social History Social History   Tobacco Use  . Smoking status: Former Research scientist (life sciences)  . Smokeless tobacco: Never Used  Substance Use Topics  . Alcohol use: No  . Drug use: No     Allergies   Patient has no known allergies.   Review of Systems Review of Systems  All other systems reviewed and are negative.    Physical Exam Updated Vital Signs BP 98/67 (BP Location: Left Arm)   Pulse 88   Temp 98.6 F (37 C) (Oral)   Resp 17   Ht 5\' 1"  (1.549 m)   Wt 49.9 kg   LMP 04/06/2019   SpO2 100%   BMI 20.78 kg/m   Physical Exam Vitals signs and nursing note reviewed.  Constitutional:      General: She is not in acute distress.    Appearance: She is well-developed.  HENT:     Head: Normocephalic and atraumatic.  Neck:      Musculoskeletal: Neck supple.  Cardiovascular:     Rate and Rhythm: Normal rate and regular rhythm.     Heart sounds: Normal heart sounds. No murmur.  Pulmonary:     Effort: Pulmonary effort is normal. No respiratory distress.     Breath sounds: Normal breath sounds.  Abdominal:     General: There is no distension.     Palpations: Abdomen is soft.     Tenderness: There is no abdominal tenderness.  Skin:    General: Skin is warm and dry.  Neurological:     Mental Status: She is alert and oriented to person, place, and time.      ED Treatments / Results  Labs (all labs ordered are listed, but only abnormal results are displayed) Labs Reviewed  CBC WITH DIFFERENTIAL/PLATELET  COMPREHENSIVE METABOLIC PANEL  SALICYLATE LEVEL  ACETAMINOPHEN LEVEL  RAPID URINE DRUG SCREEN, HOSP PERFORMED  ETHANOL    EKG EKG Interpretation  Date/Time:  Friday April 17 2019 17:09:25 EDT Ventricular Rate:  94 PR Interval:    QRS Duration: 69 QT Interval:  352 QTC Calculation: 441 R Axis:   67 Text Interpretation:  Sinus rhythm Probable left atrial enlargement ST-t wave abnormality Abnormal ECG Confirmed by Carmin Muskrat [phone]) on 04/17/2019 5:21:27 PM   Radiology No results found.  Procedures Procedures (including critical care time)  Medications Ordered in ED Medications  sodium chloride 0.9 % bolus 1,000 mL (has no administration in time range)     Initial Impression / Assessment and Plan / ED Course  I have reviewed the triage vital signs and the nursing notes.  Pertinent labs & imaging results that were available during my care of the patient were reviewed by me and considered in my medical decision making (see chart for details).       Anita Love is a 23 y.o. female who presents to ED for evaluation after overdose on ibuprofen.  Well-appearing, hemodynamically stable on exam.  No complaints currently.  States this was not a suicide attempt and does not want to  kill herself.  Tells me  that she just wanted to feel numb from the pain she was experiencing after being told she had herpes. Labs and TTS pending at shift change. Care assumed by oncoming provider PA Roxan Hockeyobinson who will follow up and dispo appropriately.    Final Clinical Impressions(s) / ED Diagnoses   Final diagnoses:  None    ED Discharge Orders    None       Taiyo Kozma, Chase PicketJaime Pilcher, PA-C 04/17/19 1801    Gerhard MunchLockwood, Robert, MD 04/18/19 1712

## 2019-04-17 NOTE — ED Notes (Signed)
PA at bedside.

## 2019-04-17 NOTE — Progress Notes (Signed)
TTS spoke with Estill Bamberg, states ED staff are in Covid + room and cannot access cart at this time. States will call back when cart is ready.  Lind Covert, MSW, LCSW Therapeutic Triage Specialist  [phone]

## 2019-04-17 NOTE — ED Triage Notes (Signed)
Pt arrived via EMS from.  Pt reports that she took 8-10 pills of 400 mg Ibuprofen. Pt reports that she found that she fnd out today she has herpes and was feeling down. Pt denies SI to EMS and states to EMS  That   she felt really hurt   18G left AC.    EMS 100/61, HR 92, RR 16, O2 99% .

## 2019-04-17 NOTE — ED Provider Notes (Signed)
Care assumed at shift change by PA Ward pending monitoring, for a Tylenol level and TTS evaluation.  Briefly, patient presenting with intentional overdose of ibuprofen after having a tough diagnosis of herpes simplex via Pap smear.  She reportedly states she took the medication to feel numb all over though did not have intentions of wanting to kill herself.  Plan for repeat Tylenol level and TTS disposition. Physical Exam  BP 106/67   Pulse 81   Temp 98.6 F (37 C) (Oral)   Resp 15   Ht 5\' 1"  (1.549 m)   Wt 49.9 kg   LMP 04/06/2019   SpO2 100%   BMI 20.78 kg/m   Physical Exam Vitals signs and nursing note reviewed.  Constitutional:      Appearance: She is well-developed.  HENT:     Head: Normocephalic and atraumatic.  Eyes:     Conjunctiva/sclera: Conjunctivae normal.  Cardiovascular:     Rate and Rhythm: Normal rate.  Pulmonary:     Effort: Pulmonary effort is normal.  Abdominal:     Palpations: Abdomen is soft.  Skin:    General: Skin is warm.  Neurological:     Mental Status: She is alert.  Psychiatric:        Behavior: Behavior normal.     ED Course/Procedures     Procedures Results for orders placed or performed during the hospital encounter of 04/17/19  CBC with Differential  Result Value Ref Range   WBC 5.5 4.0 - 10.5 K/uL   RBC 4.59 3.87 - 5.11 MIL/uL   Hemoglobin 13.6 12.0 - 15.0 g/dL   HCT 04/19/19 90.2 - 40.9 %   MCV 90.8 80.0 - 100.0 fL   MCH 29.6 26.0 - 34.0 pg   MCHC 32.6 30.0 - 36.0 g/dL   RDW 73.5 32.9 - 92.4 %   Platelets 253 150 - 400 K/uL   nRBC 0.0 0.0 - 0.2 %   Neutrophils Relative % 55 %   Neutro Abs 3.1 1.7 - 7.7 K/uL   Lymphocytes Relative 34 %   Lymphs Abs 1.9 0.7 - 4.0 K/uL   Monocytes Relative 10 %   Monocytes Absolute 0.5 0.1 - 1.0 K/uL   Eosinophils Relative 0 %   Eosinophils Absolute 0.0 0.0 - 0.5 K/uL   Basophils Relative 1 %   Basophils Absolute 0.0 0.0 - 0.1 K/uL   WBC Morphology MORPHOLOGY UNREMARKABLE    Immature  Granulocytes 0 %   Abs Immature Granulocytes 0.01 0.00 - 0.07 K/uL  Comprehensive metabolic panel  Result Value Ref Range   Sodium 140 135 - 145 mmol/L   Potassium 3.7 3.5 - 5.1 mmol/L   Chloride 106 98 - 111 mmol/L   CO2 24 22 - 32 mmol/L   Glucose, Bld 99 70 - 99 mg/dL   BUN 11 6 - 20 mg/dL   Creatinine, Ser 26.8 0.44 - 1.00 mg/dL   Calcium 9.8 8.9 - 3.41 mg/dL   Total Protein 8.2 (H) 6.5 - 8.1 g/dL   Albumin 4.4 3.5 - 5.0 g/dL   AST 15 15 - 41 U/L   ALT 11 0 - 44 U/L   Alkaline Phosphatase 56 38 - 126 U/L   Total Bilirubin 0.8 0.3 - 1.2 mg/dL   GFR calc non Af Amer >60 >60 mL/min   GFR calc Af Amer >60 >60 mL/min   Anion gap 10 5 - 15  Salicylate level  Result Value Ref Range   Salicylate Lvl <7.0 2.8 -  30.0 mg/dL  Acetaminophen level  Result Value Ref Range   Acetaminophen (Tylenol), Serum <10 (L) 10 - 30 ug/mL  Urine rapid drug screen (hosp performed)  Result Value Ref Range   Opiates NONE DETECTED NONE DETECTED   Cocaine NONE DETECTED NONE DETECTED   Benzodiazepines NONE DETECTED NONE DETECTED   Amphetamines NONE DETECTED NONE DETECTED   Tetrahydrocannabinol POSITIVE (A) NONE DETECTED   Barbiturates NONE DETECTED NONE DETECTED  Ethanol  Result Value Ref Range   Alcohol, Ethyl (B) <10 <10 mg/dL  Acetaminophen level  Result Value Ref Range   Acetaminophen (Tylenol), Serum <10 (L) 10 - 30 ug/mL   No results found.  MDM  Discussed TTS recommendation for inpatient treatment with patient.  Patient became upset stating she feels more comfortable at home and had no intention of ending her life.  She continues to request reevaluation by psychiatry.  I followed up with psychiatry provider, who is amenable to plan for observation overnight and reevaluation in the morning.  Patient made aware she is required to spend the night for observation.  If patient attempts to leave the facility, she will need IVC with concern for her safety.  Orders for sitter placed.        Christopherjohn Schiele, [place] N, PA-C 04/17/19 2338    Carmin Muskrat, MD 04/18/19 [phone]

## 2019-04-17 NOTE — ED Triage Notes (Signed)
[facility] called  Recommendations  Expected -GI upset N/v, Abdominal pain Treatment Oral hydration CMP,  EKG 4 hour Tylenol level recheck  In labs Normal with 6 hours clear for discharge.

## 2019-04-17 NOTE — Progress Notes (Addendum)
Provider [place] called TTS and requested to speak with provider in regards to inpt disposition. Advised pt can be reassessed in AM if EDP disagrees with disposition and does not want to IVC the pt. EDP requested to speak with [facility] provider.   Requests [place] provider call [facility] EDP [place] at [phone].  Lind Covert, MSW, LCSW Therapeutic Triage Specialist  [phone]

## 2019-04-17 NOTE — ED Notes (Signed)
Pt is alert and verbally responsive. Pt denies and SI, or visual or auditory hallucinations. Pt reports that she was not trying to kill herself and at the moment if finding out bad news became emotional and felt hurt not  physically but emotional hurt . Pt denies similar events in the past .

## 2019-04-17 NOTE — Progress Notes (Addendum)
TTS consulted with Lindon Romp, NP who recommends inpt tx and IVC if pt does not agree to sign VOL consent for tx. EDP [place], [place] has been advised.   Lind Covert, MSW, LCSW Therapeutic Triage Specialist  [phone]

## 2019-04-18 ENCOUNTER — Inpatient Hospital Stay (HOSPITAL_COMMUNITY)
Admission: AD | Admit: 2019-04-18 | Discharge: 2019-04-20 | DRG: 881 | Disposition: A | Discharge status: Home / Self Care | Payer: Medicaid Other | Source: Intra-hospital | Attending: Psychiatry | Admitting: Psychiatry

## 2019-04-18 ENCOUNTER — Encounter (HOSPITAL_COMMUNITY): Payer: Self-pay

## 2019-04-18 DIAGNOSIS — F329 Major depressive disorder, single episode, unspecified: Secondary | ICD-10-CM | POA: Diagnosis present

## 2019-04-18 DIAGNOSIS — Z20828 Contact with and (suspected) exposure to other viral communicable diseases: Secondary | ICD-10-CM | POA: Diagnosis present

## 2019-04-18 DIAGNOSIS — T39312A Poisoning by propionic acid derivatives, intentional self-harm, initial encounter: Secondary | ICD-10-CM | POA: Diagnosis present

## 2019-04-18 DIAGNOSIS — A6000 Herpesviral infection of urogenital system, unspecified: Secondary | ICD-10-CM | POA: Diagnosis present

## 2019-04-18 DIAGNOSIS — Z87891 Personal history of nicotine dependence: Secondary | ICD-10-CM

## 2019-04-18 DIAGNOSIS — F322 Major depressive disorder, single episode, severe without psychotic features: Secondary | ICD-10-CM | POA: Diagnosis not present

## 2019-04-18 LAB — PREGNANCY, URINE: Preg Test, Ur: NEGATIVE

## 2019-04-18 LAB — SARS CORONAVIRUS 2 BY RT PCR (HOSPITAL ORDER, PERFORMED IN ~~LOC~~ HOSPITAL LAB): SARS Coronavirus 2: NEGATIVE

## 2019-04-18 MED ORDER — ACETAMINOPHEN 325 MG PO TABS: 650.0000 mg | ORAL_TABLET | Freq: Four times a day (QID) | ORAL | Status: DC | PRN

## 2019-04-18 MED ORDER — MAGNESIUM HYDROXIDE 400 MG/5ML PO SUSP: 30.0000 mL | Freq: Every day | ORAL | Status: DC | PRN

## 2019-04-18 MED ORDER — HYDROXYZINE HCL 25 MG PO TABS
25.0000 mg | ORAL_TABLET | Freq: Three times a day (TID) | ORAL | Status: DC | PRN
  Administered 2019-04-18: 25 mg via ORAL
  Filled 2019-04-18: qty 1

## 2019-04-18 MED ORDER — TRAZODONE HCL 50 MG PO TABS
50.0000 mg | ORAL_TABLET | Freq: Every evening | ORAL | Status: DC | PRN
  Filled 2019-04-18 (×4): qty 1

## 2019-04-18 MED ORDER — VALACYCLOVIR HCL 500 MG PO TABS
500.0000 mg | ORAL_TABLET | Freq: Every day | ORAL | Status: DC
  Administered 2019-04-19 – 2019-04-20 (×2): 500 mg via ORAL
  Filled 2019-04-18 (×5): qty 1

## 2019-04-18 MED ORDER — METRONIDAZOLE 500 MG PO TABS
500.0000 mg | ORAL_TABLET | Freq: Two times a day (BID) | ORAL | Status: DC
  Administered 2019-04-18 – 2019-04-20 (×4): 500 mg via ORAL
  Filled 2019-04-18 (×5): qty 1
  Filled 2019-04-18: qty 2
  Filled 2019-04-18 (×3): qty 1

## 2019-04-18 MED ORDER — ALUM & MAG HYDROXIDE-SIMETH 200-200-20 MG/5ML PO SUSP: 30.0000 mL | ORAL | Status: DC | PRN

## 2019-04-18 NOTE — ED Notes (Signed)
Safe transport contacted for transport 

## 2019-04-18 NOTE — Tx Team (Signed)
Initial Treatment Plan 04/18/2019 5:34 PM Anita Bornhorst PTW:[id number]    PATIENT STRESSORS: Other: recent diagnosis of herpes   PATIENT STRENGTHS: Ability for insight Average or above average intelligence Capable of independent living Communication skills Motivation for treatment/growth Supportive family/friends   PATIENT IDENTIFIED PROBLEMS:      "overwhelmed with diagnosis"     " felt ashamed"             DISCHARGE CRITERIA:  Improved stabilization in mood, thinking, and/or behavior Reduction of life-threatening or endangering symptoms to within safe limits  PRELIMINARY DISCHARGE PLAN: Outpatient therapy Return to previous living arrangement Return to previous work or school arrangements  PATIENT/FAMILY INVOLVEMENT: This treatment plan has been presented to and reviewed with the patient, Anita Love, The patient has been given the opportunity to ask questions and make suggestions.  Waymond Cera, RN 04/18/2019, 5:34 PM

## 2019-04-18 NOTE — ED Notes (Signed)
TTS talking w/ pt

## 2019-04-18 NOTE — ED Notes (Signed)
Pt ambulatory w/o difficulty to [facility] w/ safe transport, belongings sent with pt

## 2019-04-18 NOTE — Progress Notes (Signed)
Patient is a 23 year old AA female admitted from [facility] ED for intentional OD of ibuprofen after receiving 'tough' herpes diagnosis.. Pt denied this being  a suicide attempt, stating, "I was just overwhelmed" and " I wanted to forget about the diagnosis". Pt has no prior psych or med hx- and states, "I would never kill myself". Pt reports that she lives alone with her one year old daughter and will be starting a new job this Monday. Pt reports having supportive family and friends. Pt was friendly, calm and cooperative, answered questions logically and coherently throughout admission interview. Pt currently denies SI/HI and A/V hallucinations. VS obtained. Skin assessment revealed no abnormalities. Belongings searched (clothes) and kept with patient. Admission paperwork completed and signed. Verbal understanding expressed. Pt oriented to unit. Q 15 min checks initiated for safety.

## 2019-04-18 NOTE — ED Notes (Signed)
Dave TTS into tsalk w/ pt

## 2019-04-18 NOTE — ED Notes (Signed)
On the phone 

## 2019-04-18 NOTE — ED Notes (Signed)
telepsych in progress with Otila Kluver NP and Waunita Schooner TTS.  Pt denies si/hi/avh at this time.  Pt reports that she got unexpected news yesterday..."shocking...made me feel humiliated..."  Pt reports that she had time to think about it during the night.."able to process it.Marland KitchenMarland Kitchen"Pt stated that she "didn't want to kill myself...felt overwhelmed..."  Pt reports that she has no previous hx of SA,no hx family hx of SA, just get a new job, has a 34yr old daughter.  Pt reports that she lives alone with her child.

## 2019-04-18 NOTE — ED Notes (Signed)
Up tot he bathroom to shower and change scrubs 

## 2019-04-19 LAB — TSH: TSH: 0.908 u[IU]/mL (ref 0.350–4.500)

## 2019-04-19 MED ORDER — ESCITALOPRAM OXALATE 5 MG PO TABS
5.0000 mg | ORAL_TABLET | Freq: Every day | ORAL | Status: DC
  Administered 2019-04-19 – 2019-04-20 (×2): 5 mg via ORAL
  Filled 2019-04-19 (×4): qty 1

## 2019-04-19 MED ORDER — TRAZODONE 25 MG HALF TABLET
25.0000 mg | ORAL_TABLET | Freq: Every evening | ORAL | Status: DC | PRN
  Filled 2019-04-19 (×6): qty 1

## 2019-04-19 MED ORDER — HYDROXYZINE HCL 10 MG PO TABS
10.0000 mg | ORAL_TABLET | Freq: Three times a day (TID) | ORAL | Status: DC | PRN
  Administered 2019-04-19: 10 mg via ORAL
  Filled 2019-04-19: qty 1

## 2019-04-19 NOTE — Progress Notes (Signed)
[facility] NOVEL CORONAVIRUS (COVID-19) DAILY CHECK-OFF SYMPTOMS - answer yes or no to each - every day NO YES  Have you had a fever in the past 24 hours?  . Fever (Temp > 37.80C / 100F) X   Have you had any of these symptoms in the past 24 hours? . New Cough .  Sore Throat  .  Shortness of Breath .  Difficulty Breathing .  Unexplained Body Aches   X   Have you had any one of these symptoms in the past 24 hours not related to allergies?   . Runny Nose .  Nasal Congestion .  Sneezing   X   If you have had runny nose, nasal congestion, sneezing in the past 24 hours, has it worsened?  X   EXPOSURES - check yes or no X   Have you traveled outside the state in the past 14 days?  X   Have you been in contact with someone with a confirmed diagnosis of COVID-19 or PUI in the past 14 days without wearing appropriate PPE?  X   Have you been living in the same home as a person with confirmed diagnosis of COVID-19 or a PUI (household contact)?    X   Have you been diagnosed with COVID-19?    X              What to do next: Answered NO to all: Answered YES to anything:   Proceed with unit schedule Follow the [facility] Inpatient Flowsheet.   

## 2019-04-19 NOTE — H&P (Signed)
Psychiatric Admission Assessment Adult  Patient Identification: Anita Love  MRN:  [id number]  Date of Evaluation:  04/19/2019  Chief Complaint: Intentional drug overdose in a suicide attempt.  Principal Diagnosis: MDD (major depressive disorder)  Diagnosis:  Principal Problem:   MDD (major depressive disorder)  History of Present Illness: (Per Md's admission SRA):  Patient is a 23 year old female who presented to the [facility] emergency department on 04/17/2019 after an intentional overdose of 8-400 mg ibuprofen pills.  The patient stated that she was in her normal state of health, and had recently seen her primary physician.  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  She stated that this was overwhelming and shocking to her.  She is in a stable relationship and was angry at first and wanted to blame her significant other.  She stated that her boyfriend told her that he was unaware that he had herpes.  She became guilt ridden and emotional.  She was unable to stop crying, and her family members became concerned about her.  She stated that this was just an emotional reaction.  She has been in therapy in the past, but no formal psychiatric treatment.  She denied any previous suicide attempts.  She denied any previous psychiatric medications or treatment.  She admitted to increased stress, but denied that she would ever hurt her self.  She has a 21-year-old child.  She denied suicidal ideation, helplessness, hopelessness or worthlessness, but she is still very anxious with regard to the guilt and remorse that she has about obtaining the sexually transmitted disease.  She was admitted to the hospital for evaluation and stabilization.  Associated Signs/Symptoms: Depression Symptoms:  depressed mood, feelings of worthlessness/guilt, hopelessness, anxiety,  (Hypo) Manic Symptoms:  Impulsivity,  Anxiety Symptoms:  Excessive Worry,  Psychotic  Symptoms:  Denies any hallucinations, delusions or paranoia  PTSD Symptoms: NA  Total Time spent with patient: 1 hour  Past Psychiatric History: Denies any previous mental health hx.  Is the patient at risk to self? No.  Has the patient been a risk to self in the past 6 months? No.  Has the patient been a risk to self within the distant past? No.  Is the patient a risk to others? No.  Has the patient been a risk to others in the past 6 months? No.  Has the patient been a risk to others within the distant past? No.   Prior Inpatient Therapy: No Prior Outpatient Therapy: No  Alcohol Screening: 1. How often do you have a drink containing alcohol?: Never 2. How many drinks containing alcohol do you have on a typical day when you are drinking?: 1 or 2 3. How often do you have six or more drinks on one occasion?: Never AUDIT-C Score: 0 4. How often during the last year have you found that you were not able to stop drinking once you had started?: Never 5. How often during the last year have you failed to do what was normally expected from you becasue of drinking?: Never 6. How often during the last year have you needed a first drink in the morning to get yourself going after a heavy drinking session?: Never 7. How often during the last year have you had a feeling of guilt of remorse after drinking?: Never 8. How often during the last year have you been unable to remember what happened the night before because you had been drinking?: Never 9. Have you or someone else  been injured as a result of your drinking?: No 10. Has a relative or friend or a doctor or another health worker been concerned about your drinking or suggested you cut down?: No Alcohol Use Disorder Identification Test Final Score (AUDIT): 0 Alcohol Brief Interventions/Follow-up: AUDIT Score <7 follow-up not indicated  Substance Abuse History in the last 12 months:  Yes.    Consequences of Substance Abuse:Discussed with  patient during this evaluation.  Medical Consequences:  Liver damage, Possible death by overdose Legal Consequences:  Arrests, jail time, Loss of driving privilege. Family Consequences:  Family discord, divorce and or separation.  Previous Psychotropic Medications: No   Psychological Evaluations: No   Past Medical History:  Past Medical History:  Diagnosis Date  . Headache    no meds   . Medical history non-contributory     Past Surgical History:  Procedure Laterality Date  . CESAREAN SECTION N/A 11/14/2017   Procedure: CESAREAN SECTION;  Surgeon: Tereso Newcomer, MD;  Location: [facility];  Service: Obstetrics;  Laterality: N/A;  . NO PAST SURGERIES     Family History:  Family History  Problem Relation Age of Onset  . ADD / ADHD Neg Hx   . Anxiety disorder Neg Hx   . Cancer Neg Hx   . Hypertension Neg Hx   . Miscarriages / Stillbirths Neg Hx   . Vision loss Neg Hx   . Varicose Veins Neg Hx   . Stroke Neg Hx   . Obesity Neg Hx   . Learning disabilities Neg Hx   . Kidney disease Neg Hx   . Intellectual disability Neg Hx   . Hyperlipidemia Neg Hx   . Heart disease Neg Hx   . Early death Neg Hx   . Hearing loss Neg Hx   . Drug abuse Neg Hx   . Diabetes Neg Hx   . Depression Neg Hx   . COPD Neg Hx   . Birth defects Neg Hx   . Asthma Neg Hx   . Arthritis Neg Hx   . Alcohol abuse Neg Hx    Family Psychiatric  History: None reported  Tobacco Screening: NA  Social History:  Social History   Substance and Sexual Activity  Alcohol Use No     Social History   Substance and Sexual Activity  Drug Use No    Additional Social History:  Allergies:  No Known Allergies  Lab Results:  Results for orders placed or performed during the hospital encounter of 04/17/19 (from the past 48 hour(s))  Pregnancy, urine     Status: None   Collection Time: 04/17/19  5:29 PM  Result Value Ref Range   Preg Test, Ur NEGATIVE NEGATIVE    Comment:        THE  SENSITIVITY OF THIS METHODOLOGY IS >20 mIU/mL. Performed at [facility], [address] [place], [place] [address]   CBC with Differential     Status: None   Collection Time: 04/17/19  5:33 PM  Result Value Ref Range   WBC 5.5 4.0 - 10.5 K/uL   RBC 4.59 3.87 - 5.11 MIL/uL   Hemoglobin 13.6 12.0 - 15.0 g/dL   HCT 60.4 54.0 - 98.1 %   MCV 90.8 80.0 - 100.0 fL   MCH 29.6 26.0 - 34.0 pg   MCHC 32.6 30.0 - 36.0 g/dL   RDW 19.1 47.8 - 29.5 %   Platelets 253 150 - 400 K/uL   nRBC 0.0 0.0 -  0.2 %   Neutrophils Relative % 55 %   Neutro Abs 3.1 1.7 - 7.7 K/uL   Lymphocytes Relative 34 %   Lymphs Abs 1.9 0.7 - 4.0 K/uL   Monocytes Relative 10 %   Monocytes Absolute 0.5 0.1 - 1.0 K/uL   Eosinophils Relative 0 %   Eosinophils Absolute 0.0 0.0 - 0.5 K/uL   Basophils Relative 1 %   Basophils Absolute 0.0 0.0 - 0.1 K/uL   WBC Morphology MORPHOLOGY UNREMARKABLE    Immature Granulocytes 0 %   Abs Immature Granulocytes 0.01 0.00 - 0.07 K/uL    Comment: Performed at [facility], [address] [place], [place] [address]  Comprehensive metabolic panel     Status: Abnormal   Collection Time: 04/17/19  5:33 PM  Result Value Ref Range   Sodium 140 135 - 145 mmol/L   Potassium 3.7 3.5 - 5.1 mmol/L   Chloride 106 98 - 111 mmol/L   CO2 24 22 - 32 mmol/L   Glucose, Bld 99 70 - 99 mg/dL   BUN 11 6 - 20 mg/dL   Creatinine, Ser 6.04 0.44 - 1.00 mg/dL   Calcium 9.8 8.9 - 54.0 mg/dL   Total Protein 8.2 (H) 6.5 - 8.1 g/dL   Albumin 4.4 3.5 - 5.0 g/dL   AST 15 15 - 41 U/L   ALT 11 0 - 44 U/L   Alkaline Phosphatase 56 38 - 126 U/L   Total Bilirubin 0.8 0.3 - 1.2 mg/dL   GFR calc non Af Amer >60 >60 mL/min   GFR calc Af Amer >60 >60 mL/min   Anion gap 10 5 - 15    Comment: Performed at [facility], [address] [place], [place] [address]  Salicylate level     Status: None   Collection Time: 04/17/19  5:33 PM  Result Value Ref Range    Salicylate Lvl <7.0 2.8 - 30.0 mg/dL    Comment: Performed at [facility], [address] [place], [place] [address]  Acetaminophen level     Status: Abnormal   Collection Time: 04/17/19  5:33 PM  Result Value Ref Range   Acetaminophen (Tylenol), Serum <10 (L) 10 - 30 ug/mL    Comment: (NOTE) Therapeutic concentrations vary significantly. A range of 10-30 ug/mL  may be an effective concentration for many patients. However, some  are best treated at concentrations outside of this range. Acetaminophen concentrations >150 ug/mL at 4 hours after ingestion  and >50 ug/mL at 12 hours after ingestion are often associated with  toxic reactions. Performed at [facility], [address] [place], [place] [address]   Urine rapid drug screen (hosp performed)     Status: Abnormal   Collection Time: 04/17/19  5:33 PM  Result Value Ref Range   Opiates NONE DETECTED NONE DETECTED   Cocaine NONE DETECTED NONE DETECTED   Benzodiazepines NONE DETECTED NONE DETECTED   Amphetamines NONE DETECTED NONE DETECTED   Tetrahydrocannabinol POSITIVE (A) NONE DETECTED   Barbiturates NONE DETECTED NONE DETECTED    Comment: (NOTE) DRUG SCREEN FOR MEDICAL PURPOSES ONLY.  IF CONFIRMATION IS NEEDED FOR ANY PURPOSE, NOTIFY LAB WITHIN 5 DAYS. LOWEST DETECTABLE LIMITS FOR URINE DRUG SCREEN Drug Class                     Cutoff (ng/mL) Amphetamine and metabolites    1000 Barbiturate and metabolites    200 Benzodiazepine  200 Tricyclics and metabolites     300 Opiates and metabolites        300 Cocaine and metabolites        300 THC                            50 Performed at [facility], [address] [place], [place] [address]   Ethanol     Status: None   Collection Time: 04/17/19  5:33 PM  Result Value Ref Range   Alcohol, Ethyl (B) <10 <10 mg/dL    Comment: (NOTE) Lowest detectable limit for serum alcohol is 10 mg/dL. For  medical purposes only. Performed at [facility], [address] [place], [place] [address]   Acetaminophen level     Status: Abnormal   Collection Time: 04/17/19 10:24 PM  Result Value Ref Range   Acetaminophen (Tylenol), Serum <10 (L) 10 - 30 ug/mL    Comment: (NOTE) Therapeutic concentrations vary significantly. A range of 10-30 ug/mL  may be an effective concentration for many patients. However, some  are best treated at concentrations outside of this range. Acetaminophen concentrations >150 ug/mL at 4 hours after ingestion  and >50 ug/mL at 12 hours after ingestion are often associated with  toxic reactions. Performed at [facility], [address] [place], [place] [address]   SARS Coronavirus 2 by RT PCR (hospital order, performed in [facility] hospital lab) Nasopharyngeal Nasopharyngeal Swab     Status: None   Collection Time: 04/18/19  1:04 PM   Specimen: Nasopharyngeal Swab  Result Value Ref Range   SARS Coronavirus 2 NEGATIVE NEGATIVE    Comment: (NOTE) If result is NEGATIVE SARS-CoV-2 target nucleic acids are NOT DETECTED. The SARS-CoV-2 RNA is generally detectable in upper and lower  respiratory specimens during the acute phase of infection. The lowest  concentration of SARS-CoV-2 viral copies this assay can detect is 250  copies / mL. A negative result does not preclude SARS-CoV-2 infection  and should not be used as the sole basis for treatment or other  patient management decisions.  A negative result may occur with  improper specimen collection / handling, submission of specimen other  than nasopharyngeal swab, presence of viral mutation(s) within the  areas targeted by this assay, and inadequate number of viral copies  (<250 copies / mL). A negative result must be combined with clinical  observations, patient history, and epidemiological information. If result is POSITIVE SARS-CoV-2 target nucleic acids are  DETECTED. The SARS-CoV-2 RNA is generally detectable in upper and lower  respiratory specimens dur ing the acute phase of infection.  Positive  results are indicative of active infection with SARS-CoV-2.  Clinical  correlation with patient history and other diagnostic information is  necessary to determine patient infection status.  Positive results do  not rule out bacterial infection or co-infection with other viruses. If result is PRESUMPTIVE POSTIVE SARS-CoV-2 nucleic acids MAY BE PRESENT.   A presumptive positive result was obtained on the submitted specimen  and confirmed on repeat testing.  While 2019 novel coronavirus  (SARS-CoV-2) nucleic acids may be present in the submitted sample  additional confirmatory testing may be necessary for epidemiological  and / or clinical management purposes  to differentiate between  SARS-CoV-2 and other Sarbecovirus currently known to infect humans.  If clinically indicated additional testing with an alternate test  methodology [phone]) is advised. The SARS-CoV-2 RNA is generally  detectable in upper and lower respiratory sp ecimens during the acute  phase of infection. The expected result is Negative. Fact Sheet for Patients:  StrictlyIdeas.no Fact Sheet for Healthcare Providers: BankingDealers.co.za This test is not yet approved or cleared by the [place] FDA and has been authorized for detection and/or diagnosis of SARS-CoV-2 by FDA under an Emergency Use Authorization (EUA).  This EUA will remain in effect (meaning this test can be used) for the duration of the COVID-19 declaration under Section 564(b)(1) of the Act, 21 U.S.C. section 360bbb-3(b)(1), unless the authorization is terminated or revoked sooner. Performed at [facility], [place] [address] [place], [place] [address]    Blood Alcohol level:  Lab Results  Component Value Date   ETH <10 04/17/2019   ETH  <5 13/24/4010   Metabolic Disorder Labs:  No results found for: HGBA1C, MPG No results found for: PROLACTIN No results found for: CHOL, TRIG, HDL, CHOLHDL, VLDL, LDLCALC  Current Medications: Current Facility-Administered Medications  Medication Dose Route Frequency Provider Last Rate Last Dose  . acetaminophen (TYLENOL) tablet 650 mg  650 mg Oral Q6H PRN Sharma Covert, MD      . alum & mag hydroxide-simeth (MAALOX/MYLANTA) 200-200-20 MG/5ML suspension 30 mL  30 mL Oral Q4H PRN Sharma Covert, MD      . escitalopram (LEXAPRO) tablet 5 mg  5 mg Oral Daily Sharma Covert, MD   5 mg at 04/19/19 2725  . hydrOXYzine (ATARAX/VISTARIL) tablet 10 mg  10 mg Oral TID PRN Sharma Covert, MD      . magnesium hydroxide (MILK OF MAGNESIA) suspension 30 mL  30 mL Oral Daily PRN Sharma Covert, MD      . metroNIDAZOLE (FLAGYL) tablet 500 mg  500 mg Oral BID Sharma Covert, MD   500 mg at 04/19/19 3664  . traZODone (DESYREL) tablet 25 mg  25 mg Oral QHS,MR X 1 Clary, Cordie Grice, MD      . valACYclovir (VALTREX) tablet 500 mg  500 mg Oral Daily Emmaline Kluver, FNP   500 mg at 04/19/19 4034   PTA Medications: Medications Prior to Admission  Medication Sig Dispense Refill Last Dose  . metroNIDAZOLE (FLAGYL) 500 MG tablet Take 500 mg by mouth 2 (two) times daily.      Musculoskeletal: Strength & Muscle Tone: within normal limits Gait & Station: normal Patient leans: N/A  Psychiatric Specialty Exam: Physical Exam  Nursing note and vitals reviewed. Constitutional: She is oriented to person, place, and time. She appears well-developed.  Neck: Normal range of motion.  Cardiovascular:  Elevated pulse rate: 110  Respiratory: Effort normal.  Genitourinary:    Genitourinary Comments: Deferred   Musculoskeletal: Normal range of motion.  Neurological: She is alert and oriented to person, place, and time.  Skin: Skin is warm and dry.    Review of Systems  Constitutional: Negative  for chills and fever.  Respiratory: Negative for cough, shortness of breath and wheezing.   Cardiovascular: Negative for chest pain and palpitations.  Gastrointestinal: Negative for abdominal pain, heartburn, nausea and vomiting.  Skin: Negative.   Neurological: Negative for dizziness and headaches.  Psychiatric/Behavioral: Positive for depression and substance abuse. Negative for hallucinations, memory loss and suicidal ideas (UDS (+) for THC). The patient is nervous/anxious. The patient does not have insomnia.     Blood pressure 113/76, pulse (!) 110, temperature 97.8 F (36.6 C), temperature source Oral, resp. rate 16, height 5\' 1"  (1.549 m),  weight 47.2 kg, last menstrual period 04/06/2019, SpO2 100 %, unknown if currently breastfeeding.Body mass index is 19.65 kg/m.  General Appearance: Casual  Eye Contact:  Good  Speech:  Normal Rate  Volume:  Normal  Mood:  Anxious  Affect:  Congruent  Thought Process:  Coherent and Descriptions of Associations: Intact  Orientation:  Full (Time, Place, and Person)  Thought Content:  Logical  Suicidal Thoughts:  No  Homicidal Thoughts:  No  Memory:  Immediate;   Good Recent;   Good Remote;   Good  Judgement:  Intact  Insight:  Fair  Psychomotor Activity:  Increased  Concentration:  Concentration: Fair and Attention Span: Fair  Recall:  [employer] of Knowledge:  Fair  Language:  Good  Akathisia:  Negative  Handed:  Right  AIMS (if indicated):     Assets:  Desire for Improvement Resilience  ADL's:  Intact  Cognition:  WNL  Sleep:  Number of Hours: 6.75     Treatment Plan Summary: Daily contact with patient to assess and evaluate symptoms and progress in treatment and Medication management.  Treatment Plan/Recommendations: 1. Admit for crisis management and stabilization, estimated length of stay 3-5 days.   2. Medication management to reduce current symptoms to base line and improve the patient's overall level of functioning: See  MAR, Md's SRA & treatment plan.   Observation Level/Precautions:  15 minute checks  Laboratory:  Per ED, available lab findings reviewed. UDS positive for [facility]  Psychotherapy: Group sessions   Medications: See MAR    Consultations: As needed.  Discharge Concerns: Safety, mood stability.    Estimated LOS: 2-4 adys  Other:  Admit to the [facility].   Physician Treatment Plan for Primary Diagnosis: MDD (major depressive disorder)  Long Term Goal(s): Improvement in symptoms so as ready for discharge  Short Term Goals: Ability to identify changes in lifestyle to reduce recurrence of condition will improve, Ability to verbalize feelings will improve and Ability to demonstrate self-control will improve  Physician Treatment Plan for Secondary Diagnosis: Principal Problem:   MDD (major depressive disorder)  Long Term Goal(s): Improvement in symptoms so as ready for discharge  Short Term Goals: Ability to identify and develop effective coping behaviors will improve, Compliance with prescribed medications will improve and Ability to identify triggers associated with substance abuse/mental health issues will improve  I certify that inpatient services furnished can reasonably be expected to improve the patient's condition.    Armandina Stammer, NP, PMHNP, FNP-BC 10/25/202011:49 AM

## 2019-04-19 NOTE — Progress Notes (Signed)
D. Pt has been visible in the milieu interacting well with peers throughout the shift. Pt is friendly upon approach. Per pt's self inventory, pt rated her depression, hopelessness and anxiety a 2/2/0, respectively. Pt wrote that her goal today was "stay positive and know that everything will be okay".  Pt currently denies SI/HI and AVH .  A. Labs and vitals monitored. Pt given and educated on medications. Pt supported emotionally and encouraged to express concerns and ask questions.   R. Pt remains safe with 15 minute checks. Will continue POC.

## 2019-04-19 NOTE — [facility] Group Notes (Signed)
[facility] LCSW Group Therapy Note  Date/Time:  04/19/2019  10:00AM-11:00PM  Type of Therapy and Topic:  Group Therapy:  Music's Effect on Depression and Anxiety  Participation Level:  Minimal  Description of Group: In this process group, members discussed what types of music trigger them to worsening mental health symptoms and what they can do about this in the future.  For instance, we discussed what to do when riding in a friend's car and a song harmful to the patient starts playing.  We also discussed how music can be used as a tool to help with wellness and recovery in various ways including managing depression and anxiety as well as encouraging healthy sleep habits and avoiding relapse into old negative behaviors such as drinking, self-harming, or staying in bed all day.  Four songs were played as examples, including "Hope," "dear insecurity," "I Am Enough" and "My Own Hero."  Comments were elicited which showed the group to be in agreement that the songs were quite relatable and have the potential to help them outside of the hospital setting.  Therapeutic Goals: 1. Patients will be introduced to several specific songs that can relate to self-image and self-love in a helpful way. 2. Patients will explore the impact of different pieces of music on their feelings, i.e. uplifting, triggering, etc. 3. Patients will discuss how to use this self-knowledge to assist in recovery.  Summary of Patient Progress:  At the beginning of group, patient expressed that she was numb and could not really say how she felt, but she could say that she felt "numb" when she made her suicide attempt.  She said it was good talking to other people.  She left group to see a practitioner and did not return.  Therapeutic Modalities: Solution Focused Brief Therapy Activity   Selmer Dominion, LCSW
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
## 2019-04-19 NOTE — Progress Notes (Signed)
[facility] Notes:  (Nursing/MHT/Case Management/Adjunct)  Date:  04/19/2019  Time:  2045  Type of Therapy:  wrap up group  Participation Level:  Active  Participation Quality:  Appropriate, Attentive, Sharing and Supportive  Affect:  Appropriate  Cognitive:  Appropriate  Insight:  Improving  Engagement in Group:  Engaged  Modes of Intervention:  Clarification, Education and Support  Summary of Progress/Problems: Pt reported having a good visit with her sister during visitation. Pt plans on stopping giving her good energy and niceness to people who do not reciprocate the same to her. Pt is grateful for her 21 year old daughter.   Winfield Rast S 04/19/2019, 10:01 PM

## 2019-04-19 NOTE — BHH Suicide Risk Assessment (Signed)
[facility] Admission Suicide Risk Assessment   Nursing information obtained from:  Patient Demographic factors:  Adolescent or young adult, Living alone Current Mental Status:  Suicidal ideation indicated by others Loss Factors:  NA Historical Factors:  Impulsivity Risk Reduction Factors:  Sense of responsibility to family, Positive social support, Employed, Religious beliefs about death, Responsible for children under 107 years of age  Total Time spent with patient: 30 minutes Principal Problem: <principal problem not specified> Diagnosis:  Active Problems:   MDD (major depressive disorder)  Subjective Data: Patient is seen and examined.  Patient is a 23 year old female who presented to the [facility] emergency department on 04/17/2019 after an intentional overdose of 8-400 mg ibuprofen pills.  The patient stated that she was in her normal state of health, and had recently seen her primary physician.  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  She stated that this was overwhelming and shocking to her.  She is in a stable relationship and was angry at first and wanted to blame her significant other.  She stated that her boyfriend told her that he was unaware that he had herpes.  She became guilt ridden and emotional.  She was unable to stop crying, and her family members became concerned about her.  She stated that this was just an emotional reaction.  She has been in therapy in the past, but no formal psychiatric treatment.  She denied any previous suicide attempts.  She denied any previous psychiatric medications or treatment.  She admitted to increased stress, but denied that she would ever hurt her self.  She has a 19-year-old child.  She denied suicidal ideation, helplessness, hopelessness or worthlessness, but she is still very anxious with regard to the guilt and remorse that she has about obtaining the sexually transmitted disease.  She was admitted to  the hospital for evaluation and stabilization.  Continued Clinical Symptoms:  Alcohol Use Disorder Identification Test Final Score (AUDIT): 0 The "Alcohol Use Disorders Identification Test", Guidelines for Use in Primary Care, Second Edition.  World Pharmacologist [facility]). Score between 0-7:  no or low risk or alcohol related problems. Score between 8-15:  moderate risk of alcohol related problems. Score between 16-19:  high risk of alcohol related problems. Score 20 or above:  warrants further diagnostic evaluation for alcohol dependence and treatment.   CLINICAL FACTORS:   Severe Anxiety and/or Agitation Depression:   Impulsivity   Musculoskeletal: Strength & Muscle Tone: within normal limits Gait & Station: normal Patient leans: N/A  Psychiatric Specialty Exam: Physical Exam  Nursing note and vitals reviewed. Constitutional: She is oriented to person, place, and time. She appears well-developed and well-nourished.  HENT:  Head: Normocephalic and atraumatic.  Respiratory: Effort normal.  Neurological: She is alert and oriented to person, place, and time.    ROS  Blood pressure 113/76, pulse (!) 110, temperature 97.8 F (36.6 C), temperature source Oral, resp. rate 16, height 5\' 1"  (1.549 m), weight 47.2 kg, last menstrual period 04/06/2019, SpO2 100 %, unknown if currently breastfeeding.Body mass index is 19.65 kg/m.  General Appearance: Casual  Eye Contact:  Good  Speech:  Normal Rate  Volume:  Normal  Mood:  Anxious  Affect:  Congruent  Thought Process:  Coherent and Descriptions of Associations: Intact  Orientation:  Full (Time, Place, and Person)  Thought Content:  Logical  Suicidal Thoughts:  No  Homicidal Thoughts:  No  Memory:  Immediate;   Good Recent;  Good Remote;   Good  Judgement:  Intact  Insight:  Fair  Psychomotor Activity:  Increased  Concentration:  Concentration: Fair and Attention Span: Fair  Recall:  [employer] of Knowledge:  Fair   Language:  Good  Akathisia:  Negative  Handed:  Right  AIMS (if indicated):     Assets:  Desire for Improvement Resilience  ADL's:  Intact  Cognition:  WNL  Sleep:  Number of Hours: 6.75      COGNITIVE FEATURES THAT CONTRIBUTE TO RISK:  None    SUICIDE RISK:   Minimal: No identifiable suicidal ideation.  Patients presenting with no risk factors but with morbid ruminations; may be classified as minimal risk based on the severity of the depressive symptoms  PLAN OF CARE: Patient is seen and examined.  Patient is a 23 year old female with the above-stated past psychiatric history who is seen after an intentional overdose of ibuprofen.  She will be admitted to the hospital.  She will be integrated into the milieu.  She will be encouraged to attend groups and work on her coping skills.  She denies current suicidality, but remains significantly anxious.  She is agreeable to a medication trial of Lexapro 5 mg p.o. daily.  She is already been written for trazodone as well as hydroxyzine, but she is a very slight female.  I will decrease her dose of hydroxyzine to 10 mg p.o. every 6 hours as needed anxiety and decrease her trazodone to 25 mg p.o. nightly as needed insomnia.  Review of her laboratories showed essentially normal electrolytes, normal CBC with differential.  Pregnancy test was negative, and her drug screen was positive for marijuana.  She stated she had last used marijuana approximately 3 weeks ago.  Her vital signs are stable, she is afebrile.  We will check her TSH for completeness sake.  I certify that inpatient services furnished can reasonably be expected to improve the patient's condition.   Antonieta Pert, MD 04/19/2019, 9:24 AM

## 2019-04-20 DIAGNOSIS — F322 Major depressive disorder, single episode, severe without psychotic features: Secondary | ICD-10-CM

## 2019-04-20 MED ORDER — METRONIDAZOLE 500 MG PO TABS: 500.0000 mg | ORAL_TABLET | Freq: Two times a day (BID) | ORAL | 0 refills | Status: AC

## 2019-04-20 MED ORDER — VALACYCLOVIR HCL 500 MG PO TABS: 500.0000 mg | ORAL_TABLET | Freq: Every day | ORAL | 0 refills | Status: AC

## 2019-04-20 MED ORDER — ESCITALOPRAM OXALATE 5 MG PO TABS: 5.0000 mg | ORAL_TABLET | Freq: Every day | ORAL | 0 refills | Status: AC

## 2019-04-20 NOTE — Progress Notes (Signed)
  [facility] Adult Case Management Discharge Plan :  Will you be returning to the same living situation after discharge:  Yes,  patient reports she is returning home At discharge, do you have transportation home?: Yes,  [employer] (Lyft) Do you have the ability to pay for your medications: Yes,  Medicaid, income from employment  Release of information consent forms completed and in the chart;  Patient's signature needed at discharge.  Patient to Follow up at: Follow-up Information    [facility] Follow up on 04/22/2019.   Why: Hospital follow up appointment is Wednesday, 10/28 at 10:30a.  The provider will contact you.  Contact information: [place] [id number] ph: [phone] fx: [phone]          Next level of care provider has access to [place] and Suicide Prevention discussed: Yes,  with the patient      Has patient been referred to the Quitline?: N/A patient is not a smoker  Patient has been referred for addiction treatment: [place], [place] 04/20/2019, 10:54 AM

## 2019-04-20 NOTE — Progress Notes (Signed)
Discharge Note:  Patient discharged with lyft.  Patient denied SI and HI, contracts for safety.  Denied A/V hallucinations.  Suicide prevention information given and discussed with patient who stated she understood and had no questions.   Patient stated she received all her belongings, clothing, toiletries, etc.  Patient stated she appreciated all assistance from [facility] staff.  All required discharge information given to patient at discharge.

## 2019-04-20 NOTE — BHH Suicide Risk Assessment (Addendum)
[place] INPATIENT:  Family/Significant Other Suicide Prevention Education  Suicide Prevention Education:  Contact Attempts: Pt's sister, Leeroy Cha,  has been identified by the patient as the family member/significant other with whom the patient will be residing, and identified as the person(s) who will aid the patient in the event of a mental health crisis.  With written consent from the patient, two attempts were made to provide suicide prevention education, prior to and/or following the patient's discharge.  We were unsuccessful in providing suicide prevention education.  A suicide education pamphlet was given to the patient to share with family/significant other.  Date and time of first attempt: 04/20/2019 @ 9:40am **HIPPA compliant voicemail was left** Date and time of second attempt: 04/20/2019 @ 10am  CSW attempted to contact twice.   Trecia Rogers 04/20/2019, 9:44 AM

## 2019-04-20 NOTE — BHH Suicide Risk Assessment (Addendum)
[place] INPATIENT:  Family/Significant Other Suicide Prevention Education  Suicide Prevention Education:  SPE completed with patient, as collateral contact did no answer during either attempt. SPI pamphlet provided to pt and pt was encouraged to share information with support network, ask questions, and talk about any concerns relating to SPE. Patient denies access to guns/firearms and verbalized understanding of information provided. Mobile Crisis information also provided to patient.  Radonna Ricker, MSW, LCSW Clinical Social Worker [facility]  Phone: [phone]

## 2019-04-20 NOTE — Progress Notes (Signed)
Recreation Therapy Notes  Date:  10.26.20 Time: 0935 Location: 300 Hall Dayroom  Group Topic: Stress Management  Goal Area(s) Addresses:  Patient will identify positive stress management techniques. Patient will identify benefits of using stress management post d/c.  Behavioral Response:  Engaged  Intervention:  Stress Management  Activity :  Meditation.  LRT introduced the stress management technique meditation.  LRT played a meditation that focused on being resilient in the face adversity.  Patients were to listen and follow along to engage in activity.  Education:  Stress Management, Discharge Planning.   Education Outcome: Acknowledges Education  Clinical Observations/Feedback:  Pt attended and participated in activity.    Victorino Sparrow, LRT/CTRS         Victorino Sparrow A 04/20/2019 11:44 AM

## 2019-04-20 NOTE — Tx Team (Signed)
Interdisciplinary Treatment and Diagnostic Plan Update  04/20/2019 Time of Session: 10:30am Niger Harold MRN: [id number]  Principal Diagnosis: MDD (major depressive disorder)  Secondary Diagnoses: Principal Problem:   MDD (major depressive disorder)   Current Medications:  Current Facility-Administered Medications  Medication Dose Route Frequency Provider Last Rate Last Dose  . acetaminophen (TYLENOL) tablet 650 mg  650 mg Oral Q6H PRN Sharma Covert, MD      . alum & mag hydroxide-simeth (MAALOX/MYLANTA) 200-200-20 MG/5ML suspension 30 mL  30 mL Oral Q4H PRN Sharma Covert, MD      . escitalopram (LEXAPRO) tablet 5 mg  5 mg Oral Daily Sharma Covert, MD   5 mg at 04/20/19 0850  . hydrOXYzine (ATARAX/VISTARIL) tablet 10 mg  10 mg Oral TID PRN Sharma Covert, MD   10 mg at 04/19/19 2228  . magnesium hydroxide (MILK OF MAGNESIA) suspension 30 mL  30 mL Oral Daily PRN Sharma Covert, MD      . metroNIDAZOLE (FLAGYL) tablet 500 mg  500 mg Oral BID Sharma Covert, MD   500 mg at 04/20/19 0850  . traZODone (DESYREL) tablet 25 mg  25 mg Oral QHS,MR X 1 Clary, Cordie Grice, MD      . valACYclovir (VALTREX) tablet 500 mg  500 mg Oral Daily Emmaline Kluver, FNP   500 mg at 04/20/19 7619   PTA Medications: Medications Prior to Admission  Medication Sig Dispense Refill Last Dose  . [DISCONTINUED] metroNIDAZOLE (FLAGYL) 500 MG tablet Take 500 mg by mouth 2 (two) times daily.       Patient Stressors: Other: recent diagnosis of herpes  Patient Strengths: Ability for insight Average or above average intelligence Capable of independent living Communication skills Motivation for treatment/growth Supportive family/friends  Treatment Modalities: Medication Management, Group therapy, Case management,  1 to 1 session with clinician, Psychoeducation, Recreational therapy.   Physician Treatment Plan for Primary Diagnosis: MDD (major depressive disorder) Long Term Goal(s):  Improvement in symptoms so as ready for discharge Improvement in symptoms so as ready for discharge   Short Term Goals: Ability to identify changes in lifestyle to reduce recurrence of condition will improve Ability to verbalize feelings will improve Ability to demonstrate self-control will improve Ability to identify and develop effective coping behaviors will improve Compliance with prescribed medications will improve Ability to identify triggers associated with substance abuse/mental health issues will improve  Medication Management: Evaluate patient's response, side effects, and tolerance of medication regimen.  Therapeutic Interventions: 1 to 1 sessions, Unit Group sessions and Medication administration.  Evaluation of Outcomes: Adequate for Discharge  Physician Treatment Plan for Secondary Diagnosis: Principal Problem:   MDD (major depressive disorder)  Long Term Goal(s): Improvement in symptoms so as ready for discharge Improvement in symptoms so as ready for discharge   Short Term Goals: Ability to identify changes in lifestyle to reduce recurrence of condition will improve Ability to verbalize feelings will improve Ability to demonstrate self-control will improve Ability to identify and develop effective coping behaviors will improve Compliance with prescribed medications will improve Ability to identify triggers associated with substance abuse/mental health issues will improve     Medication Management: Evaluate patient's response, side effects, and tolerance of medication regimen.  Therapeutic Interventions: 1 to 1 sessions, Unit Group sessions and Medication administration.  Evaluation of Outcomes: Adequate for Discharge   RN Treatment Plan for Primary Diagnosis: MDD (major depressive disorder) Long Term Goal(s): Knowledge of disease and therapeutic regimen to maintain health  will improve  Short Term Goals: Ability to participate in decision making will improve,  Ability to verbalize feelings will improve, Ability to disclose and discuss suicidal ideas, Ability to identify and develop effective coping behaviors will improve and Compliance with prescribed medications will improve  Medication Management: RN will administer medications as ordered by provider, will assess and evaluate patient's response and provide education to patient for prescribed medication. RN will report any adverse and/or side effects to prescribing provider.  Therapeutic Interventions: 1 on 1 counseling sessions, Psychoeducation, Medication administration, Evaluate responses to treatment, Monitor vital signs and CBGs as ordered, Perform/monitor CIWA, COWS, AIMS and Fall Risk screenings as ordered, Perform wound care treatments as ordered.  Evaluation of Outcomes: Adequate for Discharge   LCSW Treatment Plan for Primary Diagnosis: MDD (major depressive disorder) Long Term Goal(s): Safe transition to appropriate next level of care at discharge, Engage patient in therapeutic group addressing interpersonal concerns.  Short Term Goals: Engage patient in aftercare planning with referrals and resources  Therapeutic Interventions: Assess for all discharge needs, 1 to 1 time with Social worker, Explore available resources and support systems, Assess for adequacy in community support network, Educate family and significant other(s) on suicide prevention, Complete Psychosocial Assessment, Interpersonal group therapy.  Evaluation of Outcomes: Adequate for Discharge   Progress in Treatment: Attending groups: Yes. Participating in groups: Yes. Taking medication as prescribed: Yes. Toleration medication: Yes. Family/Significant other contact made: No, will contact:  attempted to contact the patient's sister Patient understands diagnosis: Yes. Discussing patient identified problems/goals with staff: Yes. Medical problems stabilized or resolved: Yes. Denies suicidal/homicidal ideation:  Yes. Issues/concerns per patient self-inventory: No. Other:  New problem(s) identified: None   New Short Term/Long Term Goal(s):medication stabilization, elimination of SI thoughts, development of comprehensive mental wellness plan.    Patient Goals:  "Learned all about my diagnosis, and I learned I am not in this world alone"   Discharge Plan or Barriers: Patient plans to discharge home and follow up with [facility] for outpatient medication management and therapy services.   Reason for Continuation of Hospitalization: None   Estimated Length of Stay: Discharge, 04/20/2019  Attendees: Patient: Anita Love  04/20/2019 11:35 AM  Physician: Dr. Landry Mellow, MD 04/20/2019 11:35 AM  Nursing: Meriam Sprague.Kirtland Bouchard, RN 04/20/2019 11:35 AM  RN Care Manager: 04/20/2019 11:35 AM  Social Worker: Marzella Schlein, LCSW 04/20/2019 11:35 AM  Recreational Therapist:  04/20/2019 11:35 AM  Other: Marciano Sequin, NP 04/20/2019 11:35 AM  Other: Casimiro Needle.S,RN 04/20/2019 11:35 AM  Other: 04/20/2019 11:35 AM    Scribe for Treatment Team: Maeola Sarah, LCSWA 04/20/2019 11:35 AM

## 2019-04-20 NOTE — BHH Suicide Risk Assessment (Signed)
[facility] Discharge Suicide Risk Assessment   Principal Problem: MDD (major depressive disorder) Discharge Diagnoses: Principal Problem:   MDD (major depressive disorder)   Total Time spent with patient: 15 minutes  Musculoskeletal: Strength & Muscle Tone: within normal limits Gait & Station: normal Patient leans: N/A  Psychiatric Specialty Exam: Review of Systems  All other systems reviewed and are negative.   Blood pressure 120/64, pulse (!) 108, temperature 98.4 F (36.9 C), temperature source Oral, resp. rate 16, height 5\' 1"  (1.549 m), weight 47.2 kg, last menstrual period 04/06/2019, SpO2 100 %, unknown if currently breastfeeding.Body mass index is 19.65 kg/m.  General Appearance: Casual  Eye Contact::  Good  Speech:  Normal Rate409  Volume:  Normal  Mood:  Anxious  Affect:  Congruent  Thought Process:  Coherent and Descriptions of Associations: Intact  Orientation:  Full (Time, Place, and Person)  Thought Content:  Logical  Suicidal Thoughts:  No  Homicidal Thoughts:  No  Memory:  Immediate;   Good Recent;   Good Remote;   Good  Judgement:  Intact  Insight:  Fair  Psychomotor Activity:  Increased  Concentration:  Good  Recall:  Good  Fund of Knowledge:Good  Language: Good  Akathisia:  Negative  Handed:  Right  AIMS (if indicated):     Assets:  Desire for Improvement Resilience  Sleep:  Number of Hours: 5.5  Cognition: WNL  ADL's:  Intact   Mental Status Per Nursing Assessment::   On Admission:  Suicidal ideation indicated by others  Demographic Factors:  Adolescent or young adult  Loss Factors: Decline in physical health  Historical Factors: Impulsivity  Risk Reduction Factors:   Responsible for children under 78 years of age, Sense of responsibility to family, Employed and Positive social support  Continued Clinical Symptoms:  Severe Anxiety and/or Agitation Depression:   Impulsivity  Cognitive Features That Contribute To Risk:  None     Suicide Risk:  Minimal: No identifiable suicidal ideation.  Patients presenting with no risk factors but with morbid ruminations; may be classified as minimal risk based on the severity of the depressive symptoms    Plan Of Care/Follow-up recommendations:  Activity:  ad lib  Sharma Covert, MD 04/20/2019, 7:58 AM

## 2019-04-20 NOTE — Discharge Summary (Signed)
Physician Discharge Summary Note  Patient:  Anita Love is an 23 y.o., female MRN:  [id number] DOB:  Aug 10, 1995 Patient phone:  [phone] (home)  Patient address:   [address] [place] [id number],  Total Time spent with patient: 15 minutes  Date of Admission:  04/18/2019 Date of Discharge: 04/20/19  Reason for Admission:  Overdose on eight 400 mg ibuprofen tablets  Principal Problem: MDD (major depressive disorder) Discharge Diagnoses: Principal Problem:   MDD (major depressive disorder)   Past Psychiatric History: Reports history of therapy but denies history of psychiatric treatment or hospitalizations.  Past Medical History:  Past Medical History:  Diagnosis Date  . Headache    no meds   . Medical history non-contributory     Past Surgical History:  Procedure Laterality Date  . CESAREAN SECTION N/A 11/14/2017   Procedure: CESAREAN SECTION;  Surgeon: Tereso NewcomerAnyanwu, Ugonna A, MD;  Location: [facility];  Service: Obstetrics;  Laterality: N/A;  . NO PAST SURGERIES     Family History:  Family History  Problem Relation Age of Onset  . ADD / ADHD Neg Hx   . Anxiety disorder Neg Hx   . Cancer Neg Hx   . Hypertension Neg Hx   . Miscarriages / Stillbirths Neg Hx   . Vision loss Neg Hx   . Varicose Veins Neg Hx   . Stroke Neg Hx   . Obesity Neg Hx   . Learning disabilities Neg Hx   . Kidney disease Neg Hx   . Intellectual disability Neg Hx   . Hyperlipidemia Neg Hx   . Heart disease Neg Hx   . Early death Neg Hx   . Hearing loss Neg Hx   . Drug abuse Neg Hx   . Diabetes Neg Hx   . Depression Neg Hx   . COPD Neg Hx   . Birth defects Neg Hx   . Asthma Neg Hx   . Arthritis Neg Hx   . Alcohol abuse Neg Hx    Family Psychiatric  History: Denies Social History:  Social History   Substance and Sexual Activity  Alcohol Use No     Social History   Substance and Sexual Activity  Drug Use No    Social History   Socioeconomic  History  . Marital status: Single    Spouse name: Not on file  . Number of children: Not on file  . Years of education: Not on file  . Highest education level: Not on file  Occupational History  . Not on file  Social Needs  . Financial resource strain: Not on file  . Food insecurity    Worry: Not on file    Inability: Not on file  . Transportation needs    Medical: Not on file    Non-medical: Not on file  Tobacco Use  . Smoking status: Former Games developermoker  . Smokeless tobacco: Never Used  Substance and Sexual Activity  . Alcohol use: No  . Drug use: No  . Sexual activity: Yes    Birth control/protection: None  Lifestyle  . Physical activity    Days per week: Not on file    Minutes per session: Not on file  . Stress: Not on file  Relationships  . Social Musicianconnections    Talks on phone: Not on file    Gets together: Not on file    Attends religious service: Not on file    Active member of club or organization: Not on  file    Attends meetings of clubs or organizations: Not on file    Relationship status: Not on file  Other Topics Concern  . Not on file  Social History Narrative  . Not on file    Hospital Course:  From admission H&P: Patient is a 23 year old female who presented to the [facility] emergency department on 04/17/2019 after an intentional overdose of 8-400 mg ibuprofen pills. The patient stated that she was in her normal state of health, and had recently seen her primary physician. 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 She stated that this was overwhelming and shocking to her. She is in a stable relationship and was angry at first and wanted to blame her significant other. She stated that her boyfriend told her that he was unaware that he had herpes. She became guilt ridden and emotional. She was unable to stop crying, and her family members became concerned about her. She stated that this was just an emotional  reaction. She has been in therapy in the past, but no formal psychiatric treatment. She denied any previous suicide attempts. She denied any previous psychiatric medications or treatment. She admitted to increased stress, but denied that she would ever hurt her self. She has a 8-year-old child. She denied suicidal ideation, helplessness, hopelessness or worthlessness, but she is still very anxious with regard to the guilt and remorse that she has about obtaining the sexually transmitted disease. She was admitted to the hospital for evaluation and stabilization.  Ms. Pendelton was admitted after an overdose on eight 400 mg ibuprofen tablets after finding out she had herpes. She remained on the [facility] unit for two days. She was started on Lexapro and Valtrex. She participated in group therapy on the unit. She has shown  improved mood, affect, sleep, appetite, and interaction. On day of discharge, she presents with euthymic affect and reports stable mood. She expresses gratitude for being alive and states that her daughter and family provide her with a reason for living. She denies any SI/HI/AVH and contracts for safety. She is discharging on the medications listed below. She agrees to follow up at [facility] (see below). She is provided with prescriptions for medications upon discharge. She is discharging home via [place].  Physical Findings: AIMS: Facial and Oral Movements Muscles of Facial Expression: None, normal Lips and Perioral Area: None, normal Jaw: None, normal Tongue: None, normal,Extremity Movements Upper (arms, wrists, hands, fingers): None, normal Lower (legs, knees, ankles, toes): None, normal, Trunk Movements Neck, shoulders, hips: None, normal, Overall Severity Severity of abnormal movements (highest score from questions above): None, normal Incapacitation due to abnormal movements: None, normal Patient's awareness of abnormal movements (rate only patient's report): No Awareness, Dental  Status Current problems with teeth and/or dentures?: No Does patient usually wear dentures?: No  CIWA:  CIWA-Ar Total: 1 COWS:  COWS Total Score: 3  Musculoskeletal: Strength & Muscle Tone: within normal limits Gait & Station: normal Patient leans: N/A  Psychiatric Specialty Exam: Physical Exam  Nursing note and vitals reviewed. Constitutional: She is oriented to person, place, and time. She appears well-developed and well-nourished.  Cardiovascular: Normal rate.  Respiratory: Effort normal.  Neurological: She is alert and oriented to person, place, and time.    Review of Systems  Constitutional: Negative.   Respiratory: Negative for cough and shortness of breath.   Cardiovascular: Negative for chest pain.  Psychiatric/Behavioral: Positive for depression (stable on medication) and substance abuse (UDS +THC). Negative for  hallucinations and suicidal ideas. The patient is not nervous/anxious and does not have insomnia.     Blood pressure 120/64, pulse (!) 108, temperature 98.4 F (36.9 C), temperature source Oral, resp. rate 16, height 5\' 1"  (1.549 m), weight 47.2 kg, last menstrual period 04/06/2019, SpO2 100 %, unknown if currently breastfeeding.Body mass index is 19.65 kg/m.  See MD's discharge SRA      Has this patient used any form of tobacco in the last 30 days? (Cigarettes, Smokeless Tobacco, Cigars, and/or Pipes)  No  Blood Alcohol level:  Lab Results  Component Value Date   ETH <10 04/17/2019   ETH <5 10/14/2014    Metabolic Disorder Labs:  No results found for: HGBA1C, MPG No results found for: PROLACTIN No results found for: CHOL, TRIG, HDL, CHOLHDL, VLDL, LDLCALC  See Psychiatric Specialty Exam and Suicide Risk Assessment completed by Attending Physician prior to discharge.  Discharge destination:  Home  Is patient on multiple antipsychotic therapies at discharge:  No   Has Patient had three or more failed trials of antipsychotic monotherapy by history:   No  Recommended Plan for Multiple Antipsychotic Therapies: NA  Discharge Instructions    Discharge instructions   Complete by: As directed    Patient is instructed to take all prescribed medications as recommended. Report any side effects or adverse reactions to your outpatient psychiatrist. Patient is instructed to abstain from alcohol and illegal drugs while on prescription medications. In the event of worsening symptoms, patient is instructed to call the crisis hotline, 911, or go to the nearest emergency department for evaluation and treatment.     Allergies as of 04/20/2019   No Known Allergies     Medication List    TAKE these medications     Indication  escitalopram 5 MG tablet Commonly known as: LEXAPRO Take 1 tablet (5 mg total) by mouth daily. Start taking on: April 21, 2019  Indication: Major Depressive Disorder   metroNIDAZOLE 500 MG tablet Commonly known as: FLAGYL Take 1 tablet (500 mg total) by mouth 2 (two) times daily. To complete antibiotic course What changed: additional instructions  Indication: Infection   valACYclovir 500 MG tablet Commonly known as: VALTREX Take 1 tablet (500 mg total) by mouth daily. Start taking on: April 21, 2019  Indication: Genital Herpes      Follow-up Information    [facility] Follow up on 04/22/2019.   Why: Hospital follow up appointment is Wednesday, 10/28 at 10:30a.  The provider will contact you.  Contact information: [address] [place] ph: [phone] fx: [phone]          Follow-up recommendations: Activity as tolerated. Diet as recommended by primary care physician. Keep all scheduled follow-up appointments as recommended.   Comments:   Patient is instructed to take all prescribed medications as recommended. Report any side effects or adverse reactions to your outpatient psychiatrist. Patient is instructed to abstain from alcohol and illegal drugs while on prescription  medications. In the event of worsening symptoms, patient is instructed to call the crisis hotline, 911, or go to the nearest emergency department for evaluation and treatment.  Signed: [phone], NP 04/20/2019, 10:00 AM

## 2019-04-20 NOTE — Progress Notes (Signed)
D:  Patient's self inventory sheet, patient sleeps good, sleep medication helpful.  Fair appetite, normal energy level, good concentration.  Rated depression and anxiety 1, hopeless 2.  Denied withdrawals.  Denied SI.  Denied physical problems  Denied physical pain.  No pain medicine.  Goal is discharge to daughter.  Plans to discharge and tell MD she is OK. A:  Medications administered per MD orders.  Emotional support and encouragement given patient. R:  Denied SI and HI, contracts for safety.  Denied A/V hallucinations.  Safety maintained with 15 minute checks.

## 2020-12-02 ENCOUNTER — Other Ambulatory Visit: Payer: Self-pay

## 2020-12-02 ENCOUNTER — Encounter (HOSPITAL_BASED_OUTPATIENT_CLINIC_OR_DEPARTMENT_OTHER): Payer: Self-pay | Admitting: Emergency Medicine

## 2020-12-02 ENCOUNTER — Emergency Department (HOSPITAL_BASED_OUTPATIENT_CLINIC_OR_DEPARTMENT_OTHER)
Admission: EM | Admit: 2020-12-02 | Discharge: 2020-12-02 | Disposition: A | Discharge status: Home / Self Care | Payer: Medicaid Other | Attending: Emergency Medicine | Admitting: Emergency Medicine

## 2020-12-02 DIAGNOSIS — Z20822 Contact with and (suspected) exposure to covid-19: Secondary | ICD-10-CM | POA: Insufficient documentation

## 2020-12-02 DIAGNOSIS — J029 Acute pharyngitis, unspecified: Secondary | ICD-10-CM | POA: Diagnosis not present

## 2020-12-02 DIAGNOSIS — Z87891 Personal history of nicotine dependence: Secondary | ICD-10-CM | POA: Insufficient documentation

## 2020-12-02 HISTORY — DX: Papillomavirus as the cause of diseases classified elsewhere: B97.7

## 2020-12-02 LAB — RESP PANEL BY RT-PCR (FLU A&B, COVID) ARPGX2
Influenza A by PCR: NEGATIVE
Influenza B by PCR: NEGATIVE
SARS Coronavirus 2 by RT PCR: NEGATIVE

## 2020-12-02 LAB — GROUP A STREP BY PCR: Group A Strep by PCR: NOT DETECTED

## 2020-12-02 MED ORDER — PENICILLIN V POTASSIUM 500 MG PO TABS: 500.0000 mg | ORAL_TABLET | Freq: Three times a day (TID) | ORAL | 0 refills | Status: DC

## 2020-12-02 MED ORDER — PREDNISONE 50 MG PO TABS
60.0000 mg | ORAL_TABLET | Freq: Once | ORAL | Status: AC
  Administered 2020-12-02: 60 mg via ORAL
  Filled 2020-12-02: qty 1

## 2020-12-02 NOTE — ED Provider Notes (Signed)
[facility] EMERGENCY DEPT Provider Note   CSN: [id number] Arrival date & time: 12/02/20  3810     History Chief Complaint  Patient presents with   Sore Throat    Anita Love is a 25 y.o. female.  HPI  25 year old female presents today complaining of sore throat that began yesterday.  Pain is moderate.  She has not taken any interventions.  She denies any difficulty swallowing or breathing.  She reports she has had similar symptoms with HPV in the past.  Review of records reveal that she was here with a sore throat in October 2020.  At that time she received Decadron and was diagnosed with viral pharyngitis after a formed.  She reports that she had COVID last year.  She reports she has had 2 vaccines since that time.  She is not pregnant per her report and is currently menstruating.  She denies any other significant past medical history.  Past Medical History:  Diagnosis Date   Headache    no meds    HPV (human papilloma virus) infection    Medical history non-contributory     Patient Active Problem List   Diagnosis Date Noted   MDD (major depressive disorder) 04/18/2019   Intrauterine infection during labor 11/14/2017   S/P cesarean section for FTP 11/13/2017   Encounter for supervision of normal pregnancy 07/12/2017   High risk teen pregnancy 07/12/2017    Past Surgical History:  Procedure Laterality Date   CESAREAN SECTION N/A 11/14/2017   Procedure: CESAREAN SECTION;  Surgeon: Tereso Newcomer, MD;  Location: [facility];  Service: Obstetrics;  Laterality: N/A;   NO PAST SURGERIES       OB History     Gravida  1   Para  1   Term  1   Preterm      AB      Living  1      SAB      IAB      Ectopic      Multiple  0   Live Births  1           Family History  Problem Relation Age of Onset   ADD / ADHD Neg Hx    Anxiety disorder Neg Hx    Cancer Neg Hx    Hypertension Neg Hx    Miscarriages / Stillbirths Neg Hx     Vision loss Neg Hx    Varicose Veins Neg Hx    Stroke Neg Hx    Obesity Neg Hx    Learning disabilities Neg Hx    Kidney disease Neg Hx    Intellectual disability Neg Hx    Hyperlipidemia Neg Hx    Heart disease Neg Hx    Early death Neg Hx    Hearing loss Neg Hx    Drug abuse Neg Hx    Diabetes Neg Hx    Depression Neg Hx    COPD Neg Hx    Birth defects Neg Hx    Asthma Neg Hx    Arthritis Neg Hx    Alcohol abuse Neg Hx     Social History   Tobacco Use   Smoking status: Former    Pack years: 0.00   Smokeless tobacco: Never  Substance Use Topics   Alcohol use: No   Drug use: No    Home Medications Prior to Admission medications   Medication Sig Start Date End Date Taking? Authorizing Provider  escitalopram (LEXAPRO) 5  MG tablet Take 1 tablet (5 mg total) by mouth daily. 04/21/19  Yes Aldean Baker, NP  metroNIDAZOLE (FLAGYL) 500 MG tablet Take 1 tablet (500 mg total) by mouth 2 (two) times daily. To complete antibiotic course 04/20/19   Aldean Baker, NP  valACYclovir (VALTREX) 500 MG tablet Take 1 tablet (500 mg total) by mouth daily. 04/21/19   Aldean Baker, NP    Allergies    Patient has no known allergies.  Review of Systems   Review of Systems  All other systems reviewed and are negative.  Physical Exam Updated Vital Signs BP 104/73   Pulse 90   Temp 98.8 F (37.1 C) (Oral)   Resp 20   LMP 12/02/2020 (Exact Date)   SpO2 100%   Physical Exam Vitals and nursing note reviewed.  Constitutional:      Appearance: She is well-developed.  HENT:     Head: Normocephalic and atraumatic.     Right Ear: Ear canal normal.     Left Ear: Ear canal normal.     Mouth/Throat:     Mouth: Mucous membranes are moist. No oral lesions.     Pharynx: Oropharynx is clear. No pharyngeal swelling, oropharyngeal exudate, posterior oropharyngeal erythema or uvula swelling.     Tonsils: No tonsillar exudate or tonsillar abscesses.  Eyes:     Conjunctiva/sclera:  Conjunctivae normal.  Cardiovascular:     Rate and Rhythm: Normal rate and regular rhythm.  Pulmonary:     Effort: Pulmonary effort is normal.     Breath sounds: Normal breath sounds.  Abdominal:     Palpations: Abdomen is soft.  Musculoskeletal:     Cervical back: Normal range of motion and neck supple.  Skin:    General: Skin is warm and dry.     Capillary Refill: Capillary refill takes less than 2 seconds.  Neurological:     General: No focal deficit present.     Mental Status: She is alert.  Psychiatric:        Mood and Affect: Mood normal.    ED Results / Procedures / Treatments   Labs (all labs ordered are listed, but only abnormal results are displayed) Labs Reviewed - No data to display  EKG None  Radiology No results found.  Procedures Procedures   Medications Ordered in ED Medications  predniSONE (DELTASONE) tablet 60 mg (has no administration in time range)    ED Course  I have reviewed the triage vital signs and the nursing notes.  Pertinent labs & imaging results that were available during my care of the patient were reviewed by me and considered in my medical decision making (see chart for details).    MDM Rules/Calculators/A&P                          Plan prednisone for any pain and swelling COVID test and strep screen.  Patient will follow-up in MyChart.  Plan prescription for antibiotics.  She is instructed to take these only if the strep screen is positive.  Patient voices understanding of plan, need for follow-up, and return precautions. Final Clinical Impression(s) / ED Diagnoses Final diagnoses:  Sore throat    Rx / DC Orders ED Discharge Orders     None        Margarita Grizzle, MD 12/02/20 [phone]

## 2020-12-02 NOTE — Discharge Instructions (Addendum)
Please check my chart for COVID and strep test results If strep test is positive, please pick up prescription for penicillin and take as prescribed. You have received steroids here in the department for any swelling, inflammation in the back of your throat. Your exam here appears normal.

## 2021-06-09 ENCOUNTER — Telehealth: Payer: Self-pay

## 2021-06-09 NOTE — Telephone Encounter (Signed)
Pt calling; is having irregular cycle; more spotting than bleeding; spotted Mon and Wed; this am has a pink d/c; has been waiting on period but it hasn't come.  [phone]  Adv pt I could not adv her as we haven't seen her before; offered appt; adv I needed to tx her to the front desk for scheduling; pt stated "that's okay.  Bye."

## 2023-08-23 ENCOUNTER — Other Ambulatory Visit: Payer: Self-pay

## 2023-08-23 ENCOUNTER — Emergency Department: Payer: Medicaid Other

## 2023-08-23 ENCOUNTER — Emergency Department
Admission: EM | Admit: 2023-08-23 | Discharge: 2023-08-23 | Disposition: A | Discharge status: Home / Self Care | Payer: Medicaid Other | Attending: Emergency Medicine | Admitting: Emergency Medicine

## 2023-08-23 DIAGNOSIS — R350 Frequency of micturition: Secondary | ICD-10-CM | POA: Diagnosis not present

## 2023-08-23 DIAGNOSIS — R1031 Right lower quadrant pain: Secondary | ICD-10-CM | POA: Insufficient documentation

## 2023-08-23 DIAGNOSIS — O26891 Other specified pregnancy related conditions, first trimester: Secondary | ICD-10-CM | POA: Diagnosis present

## 2023-08-23 DIAGNOSIS — O26899 Other specified pregnancy related conditions, unspecified trimester: Secondary | ICD-10-CM

## 2023-08-23 DIAGNOSIS — R1032 Left lower quadrant pain: Secondary | ICD-10-CM | POA: Diagnosis not present

## 2023-08-23 LAB — LIPASE, BLOOD: Lipase: 24 U/L (ref 11–51)

## 2023-08-23 LAB — CBC
HCT: 40.3 % (ref 36.0–46.0)
Hemoglobin: 13.5 g/dL (ref 12.0–15.0)
MCH: 30.8 pg (ref 26.0–34.0)
MCHC: 33.5 g/dL (ref 30.0–36.0)
MCV: 91.8 fL (ref 80.0–100.0)
Platelets: 159 10*3/uL (ref 150–400)
RBC: 4.39 MIL/uL (ref 3.87–5.11)
RDW: 12.9 % (ref 11.5–15.5)
WBC: 4.2 10*3/uL (ref 4.0–10.5)
nRBC: 0 % (ref 0.0–0.2)

## 2023-08-23 LAB — URINALYSIS, ROUTINE W REFLEX MICROSCOPIC
Bacteria, UA: NONE SEEN
Bilirubin Urine: NEGATIVE
Glucose, UA: NEGATIVE mg/dL
Hgb urine dipstick: NEGATIVE
Ketones, ur: NEGATIVE mg/dL
Nitrite: NEGATIVE
Protein, ur: NEGATIVE mg/dL
Specific Gravity, Urine: 1.024 (ref 1.005–1.030)
pH: 5 (ref 5.0–8.0)

## 2023-08-23 LAB — COMPREHENSIVE METABOLIC PANEL
ALT: 11 U/L (ref 0–44)
AST: 18 U/L (ref 15–41)
Albumin: 4.5 g/dL (ref 3.5–5.0)
Alkaline Phosphatase: 44 U/L (ref 38–126)
Anion gap: 8 (ref 5–15)
BUN: 11 mg/dL (ref 6–20)
CO2: 23 mmol/L (ref 22–32)
Calcium: 9.3 mg/dL (ref 8.9–10.3)
Chloride: 104 mmol/L (ref 98–111)
Creatinine, Ser: 0.6 mg/dL (ref 0.44–1.00)
GFR, Estimated: >60 mL/min (ref >60)
Glucose, Bld: 105 mg/dL — ABNORMAL HIGH (ref 70–99)
Potassium: 3.6 mmol/L (ref 3.5–5.1)
Sodium: 135 mmol/L (ref 135–145)
Total Bilirubin: 1.3 mg/dL — ABNORMAL HIGH (ref 0.0–1.2)
Total Protein: 7.8 g/dL (ref 6.5–8.1)

## 2023-08-23 LAB — HCG, QUANTITATIVE, PREGNANCY: hCG, Beta Chain, Quant, S: 207 m[IU]/mL — ABNORMAL HIGH (ref <5)

## 2023-08-23 LAB — POC URINE PREG, ED: Preg Test, Ur: POSITIVE — AB

## 2023-08-23 NOTE — ED Provider Notes (Signed)
 [facility] Provider Note    Event Date/Time   First MD Initiated Contact with Patient 08/23/23 [phone]     (approximate)   History   Chief Complaint Abdominal Pain   HPI  Anita Love is a 28 y.o. female with past medical history of depression who presents to the ED complaining of abdominal pain.  Patient reports that she has been dealing with crampy pain in both sides of her lower abdomen intermittently for the past couple of days.  It became more severe overnight, making it difficult for him to sleep.  She denies any vaginal bleeding or discharge, has not had any changes in her bowel movements, and denies any nausea or vomiting.  She does report some urinary frequency but has not had any dysuria or flank pain.  Her LMP was approximately 1 month ago.     Physical Exam   Triage Vital Signs: ED Triage Vitals [08/23/23 0848]  Encounter Vitals Group     BP      Systolic BP Percentile      Diastolic BP Percentile      Pulse      Resp      Temp      Temp src      SpO2      Weight 104 lb 0.9 oz (47.2 kg)     Height      Head Circumference      Peak Flow      Pain Score 8     Pain Loc      Pain Education      Exclude from Growth Chart     Most recent vital signs: Vitals:   08/23/23 0959  BP: 107/64  Pulse: 75  Resp: 17  SpO2: 100%    Constitutional: Alert and oriented. Eyes: Conjunctivae are normal. Head: Atraumatic. Nose: No congestion/rhinnorhea. Mouth/Throat: Mucous membranes are moist.  Cardiovascular: Normal rate, regular rhythm. Grossly normal heart sounds.  2+ radial pulses bilaterally. Respiratory: Normal respiratory effort.  No retractions. Lungs CTAB. Gastrointestinal: Soft and nontender. No distention. Musculoskeletal: No lower extremity tenderness nor edema.  Neurologic:  Normal speech and language. No gross focal neurologic deficits are appreciated.    ED Results / Procedures / Treatments   Labs (all labs ordered are  listed, but only abnormal results are displayed) Labs Reviewed  COMPREHENSIVE METABOLIC PANEL - Abnormal; Notable for the following components:      Result Value   Glucose, Bld 105 (*)    Total Bilirubin 1.3 (*)    All other components within normal limits  URINALYSIS, ROUTINE W REFLEX MICROSCOPIC - Abnormal; Notable for the following components:   Color, Urine YELLOW (*)    APPearance HAZY (*)    Leukocytes,Ua TRACE (*)    All other components within normal limits  HCG, QUANTITATIVE, PREGNANCY - Abnormal; Notable for the following components:   hCG, Beta Chain, Quant, S 207 (*)    All other components within normal limits  POC URINE PREG, ED - Abnormal; Notable for the following components:   Preg Test, Ur Positive (*)    All other components within normal limits  LIPASE, BLOOD  CBC    RADIOLOGY Obstetric ultrasound reviewed and interpreted by me with no intrauterine pregnancy noted.  PROCEDURES:  Critical Care performed: No  Procedures   MEDICATIONS ORDERED IN ED: Medications - No data to display   IMPRESSION / MDM / ASSESSMENT AND PLAN / ED COURSE  I reviewed the triage vital  signs and the nursing notes.                              28 y.o. female with past medical history of depression who presents to the ED complaining of intermittent crampy pain in the bilateral lower quadrants of her abdomen that became more severe overnight.  Patient's presentation is most consistent with acute presentation with potential threat to life or bodily function.  Differential diagnosis includes, but is not limited to, ectopic pregnancy, threatened miscarriage, completed miscarriage, UTI, kidney stone, appendicitis, diverticulitis.  Patient nontoxic-appearing and in no acute distress, vital signs are unremarkable.  She has a benign abdominal exam, pregnancy testing is positive.  Labs without significant anemia, leukocytosis, electrolyte abnormality, or AKI.  LFTs and lipase are  unremarkable, urinalysis shows no signs of infection.  We will further assess with beta-hCG levels and obstetric ultrasound, reassess following imaging.  Beta-hCG levels are quite low at 207, ultrasound results are pending at this time.  Patient unfortunately requesting to leave and is unwilling to stay for results of her ultrasound.  I explained that we cannot exclude ectopic pregnancy or miscarriage, patient expresses understanding of the risks of leaving prior to ultrasound results.  She is willing to sign out AGAINST MEDICAL ADVICE.  I explained that she will likely need repeat ultrasound and beta-hCG levels in 1 week, referral provided back to her OB/GYN.  She was counseled to return to the ED for new or worsening symptoms.  Patient agrees with plan.      FINAL CLINICAL IMPRESSION(S) / ED DIAGNOSES   Final diagnoses:  Abdominal pain during pregnancy in first trimester     Rx / DC Orders   ED Discharge Orders     None        Note:  This document was prepared using Dragon voice recognition software and may include unintentional dictation errors.   Chesley Noon, MD 08/23/23 [phone]

## 2023-08-23 NOTE — ED Triage Notes (Signed)
 C/O lower abdominal/pelvic cramping pain x 4 days.  Normal vaginal discharge. Denies dysuria, states voiding more than normal.

## 2023-08-26 ENCOUNTER — Telehealth: Payer: Self-pay | Admitting: Obstetrics and Gynecology

## 2023-08-26 NOTE — Telephone Encounter (Signed)
 Attempted to reach patient about scheduling an appointment for a lab draw. Left a message for her to call the office to schedule.

## 2023-08-27 ENCOUNTER — Telehealth (HOSPITAL_BASED_OUTPATIENT_CLINIC_OR_DEPARTMENT_OTHER): Payer: Self-pay | Admitting: Certified Nurse Midwife

## 2023-08-27 NOTE — Telephone Encounter (Signed)
 RN will call patient and schedule Repeat HCG. RN will be certain patient is aware of ectopic precautions.Anita Love

## 2023-08-29 ENCOUNTER — Other Ambulatory Visit (HOSPITAL_BASED_OUTPATIENT_CLINIC_OR_DEPARTMENT_OTHER)

## 2023-08-29 DIAGNOSIS — N926 Irregular menstruation, unspecified: Secondary | ICD-10-CM

## 2023-08-30 ENCOUNTER — Encounter (HOSPITAL_BASED_OUTPATIENT_CLINIC_OR_DEPARTMENT_OTHER): Payer: Self-pay | Admitting: Certified Nurse Midwife

## 2023-08-30 ENCOUNTER — Telehealth (HOSPITAL_BASED_OUTPATIENT_CLINIC_OR_DEPARTMENT_OTHER): Payer: Self-pay | Admitting: Certified Nurse Midwife

## 2023-08-30 LAB — BETA HCG QUANT (REF LAB): hCG Quant: 3591 m[IU]/mL

## 2023-08-30 NOTE — Telephone Encounter (Signed)
 Tried to call patient with results of HCG. Left a message for pt to call back.  Anita Love

## 2023-09-18 ENCOUNTER — Encounter (HOSPITAL_BASED_OUTPATIENT_CLINIC_OR_DEPARTMENT_OTHER): Payer: Self-pay

## 2023-09-18 ENCOUNTER — Ambulatory Visit (HOSPITAL_BASED_OUTPATIENT_CLINIC_OR_DEPARTMENT_OTHER)

## 2023-09-18 DIAGNOSIS — Z3A01 Less than 8 weeks gestation of pregnancy: Secondary | ICD-10-CM | POA: Diagnosis not present

## 2023-09-18 DIAGNOSIS — O3680X1 Pregnancy with inconclusive fetal viability, fetus 1: Secondary | ICD-10-CM | POA: Diagnosis not present

## 2023-09-18 DIAGNOSIS — O3680X0 Pregnancy with inconclusive fetal viability, not applicable or unspecified: Secondary | ICD-10-CM

## 2023-10-08 ENCOUNTER — Encounter (HOSPITAL_BASED_OUTPATIENT_CLINIC_OR_DEPARTMENT_OTHER)

## 2023-11-25 ENCOUNTER — Encounter (HOSPITAL_BASED_OUTPATIENT_CLINIC_OR_DEPARTMENT_OTHER)
